# Patient Record
Sex: Male | Born: 1955 | Race: White | Hispanic: No | Marital: Married | Smoking: Never smoker
Health system: Southern US, Community
[De-identification: ages and names within clinical notes are randomized; demographics above are authoritative.]

## PROBLEM LIST (undated history)

## (undated) DIAGNOSIS — E785 Hyperlipidemia, unspecified: Secondary | ICD-10-CM

## (undated) DIAGNOSIS — Z789 Other specified health status: Secondary | ICD-10-CM

## (undated) DIAGNOSIS — R748 Abnormal levels of other serum enzymes: Secondary | ICD-10-CM

## (undated) DIAGNOSIS — K219 Gastro-esophageal reflux disease without esophagitis: Secondary | ICD-10-CM

## (undated) DIAGNOSIS — F17229 Nicotine dependence, chewing tobacco, with unspecified nicotine-induced disorders: Secondary | ICD-10-CM

## (undated) DIAGNOSIS — E669 Obesity, unspecified: Secondary | ICD-10-CM

## (undated) DIAGNOSIS — I2510 Atherosclerotic heart disease of native coronary artery without angina pectoris: Secondary | ICD-10-CM

## (undated) DIAGNOSIS — R0600 Dyspnea, unspecified: Secondary | ICD-10-CM

## (undated) DIAGNOSIS — M48061 Spinal stenosis, lumbar region without neurogenic claudication: Secondary | ICD-10-CM

## (undated) DIAGNOSIS — I10 Essential (primary) hypertension: Secondary | ICD-10-CM

## (undated) HISTORY — DX: Nicotine dependence, chewing tobacco, with unspecified nicotine-induced disorders: F17.229

## (undated) HISTORY — DX: Other specified health status: Z78.9

## (undated) HISTORY — DX: Obesity, unspecified: E66.9

## (undated) HISTORY — DX: Spinal stenosis, lumbar region without neurogenic claudication: M48.061

## (undated) HISTORY — DX: Abnormal levels of other serum enzymes: R74.8

## (undated) HISTORY — DX: Essential (primary) hypertension: I10

## (undated) HISTORY — DX: Gastro-esophageal reflux disease without esophagitis: K21.9

## (undated) HISTORY — DX: Hyperlipidemia, unspecified: E78.5

## (undated) HISTORY — PX: CARDIAC CATHETERIZATION: SHX172

## (undated) HISTORY — PX: OTHER SURGICAL HISTORY: SHX169

---

## 2015-10-18 DIAGNOSIS — Z6833 Body mass index (BMI) 33.0-33.9, adult: Secondary | ICD-10-CM

## 2015-10-18 DIAGNOSIS — E785 Hyperlipidemia, unspecified: Secondary | ICD-10-CM

## 2015-10-18 DIAGNOSIS — Z79899 Other long term (current) drug therapy: Secondary | ICD-10-CM

## 2015-10-18 DIAGNOSIS — E669 Obesity, unspecified: Secondary | ICD-10-CM

## 2015-10-18 DIAGNOSIS — K219 Gastro-esophageal reflux disease without esophagitis: Secondary | ICD-10-CM

## 2015-10-18 HISTORY — DX: Gastro-esophageal reflux disease without esophagitis: K21.9

## 2015-10-18 HISTORY — DX: Other long term (current) drug therapy: Z79.899

## 2015-10-18 HISTORY — DX: Body mass index (BMI) 33.0-33.9, adult: Z68.33

## 2015-10-18 HISTORY — DX: Obesity, unspecified: E66.9

## 2015-10-18 HISTORY — DX: Hyperlipidemia, unspecified: E78.5

## 2019-03-11 DIAGNOSIS — I10 Essential (primary) hypertension: Secondary | ICD-10-CM

## 2019-03-11 DIAGNOSIS — M5416 Radiculopathy, lumbar region: Secondary | ICD-10-CM

## 2019-03-11 HISTORY — DX: Essential (primary) hypertension: I10

## 2019-03-11 HISTORY — DX: Radiculopathy, lumbar region: M54.16

## 2020-02-25 ENCOUNTER — Other Ambulatory Visit: Payer: Self-pay

## 2020-02-25 DIAGNOSIS — T7840XA Allergy, unspecified, initial encounter: Secondary | ICD-10-CM

## 2020-02-25 DIAGNOSIS — I10 Essential (primary) hypertension: Secondary | ICD-10-CM

## 2020-02-25 HISTORY — DX: Essential (primary) hypertension: I10

## 2020-02-25 HISTORY — DX: Allergy, unspecified, initial encounter: T78.40XA

## 2020-02-26 ENCOUNTER — Encounter: Payer: Self-pay | Admitting: Cardiology

## 2020-02-26 ENCOUNTER — Ambulatory Visit (INDEPENDENT_AMBULATORY_CARE_PROVIDER_SITE_OTHER): Payer: PRIVATE HEALTH INSURANCE | Admitting: Cardiology

## 2020-02-26 ENCOUNTER — Other Ambulatory Visit: Payer: Self-pay

## 2020-02-26 VITALS — BP 132/84 | HR 68 | Ht 73.0 in | Wt 261.4 lb

## 2020-02-26 DIAGNOSIS — I10 Essential (primary) hypertension: Secondary | ICD-10-CM

## 2020-02-26 DIAGNOSIS — E669 Obesity, unspecified: Secondary | ICD-10-CM

## 2020-02-26 DIAGNOSIS — R0789 Other chest pain: Secondary | ICD-10-CM

## 2020-02-26 DIAGNOSIS — E785 Hyperlipidemia, unspecified: Secondary | ICD-10-CM

## 2020-02-26 DIAGNOSIS — R072 Precordial pain: Secondary | ICD-10-CM

## 2020-02-26 DIAGNOSIS — R011 Cardiac murmur, unspecified: Secondary | ICD-10-CM

## 2020-02-26 HISTORY — DX: Other chest pain: R07.89

## 2020-02-26 HISTORY — DX: Cardiac murmur, unspecified: R01.1

## 2020-02-26 MED ORDER — NITROGLYCERIN 0.4 MG SL SUBL: 0.4000 mg | SUBLINGUAL_TABLET | SUBLINGUAL | 6 refills | Status: DC | PRN

## 2020-02-26 NOTE — Patient Instructions (Signed)
Medication Instructions:  Your physician has recommended you make the following change in your medication:   Take Nitroglycerin as needed for chest pain.  *If you need a refill on your cardiac medications before your next appointment, please call your pharmacy*   Lab Work: Your physician recommends that you return for lab work in: At your echo or lexiscan appointment.  You need to have labs done when you are fasting.  You can come Monday through Friday 8:30 am to 12:00 pm and 1:15 to 4:30. You do not need to make an appointment as the order has already been placed. The labs you are going to have done are BMET, CBC, TSH, LFT and Lipids.  If you have labs (blood work) drawn today and your tests are completely normal, you will receive your results only by: Marland Kitchen MyChart Message (if you have MyChart) OR . A paper copy in the mail If you have any lab test that is abnormal or we need to change your treatment, we will call you to review the results.   Testing/Procedures: Your physician has requested that you have an echocardiogram. Echocardiography is a painless test that uses sound waves to create images of your heart. It provides your doctor with information about the size and shape of your heart and how well your heart's chambers and valves are working. This procedure takes approximately one hour. There are no restrictions for this procedure.  Your physician has requested that you have a lexiscan myoview. For further information please visit [email or web address] Please follow instruction sheet, as given.  The test will take approximately 3 to 4 hours to complete; you may bring reading material.  If someone comes with you to your appointment, they will need to remain in the main lobby due to limited space in the testing area.  How to prepare for your Myocardial Perfusion Test:  . Do not eat or drink 3 hours prior to your test, except you may have water. . Do not consume products containing  caffeine (regular or decaffeinated) 12 hours prior to your test. (ex: coffee, chocolate, sodas, tea). . Do bring a list of your current medications with you.  If not listed below, you may take your medications as normal. . Do wear comfortable clothes (no dresses or overalls) and walking shoes, tennis shoes preferred (No heels or open toe shoes are allowed). . Do NOT wear cologne, perfume, aftershave, or lotions (deodorant is allowed). . If these instructions are not followed, your test will have to be rescheduled.    Follow-Up: At [facility], you and your health needs are our priority.  As part of our continuing mission to provide you with exceptional heart care, we have created designated Provider Care Teams.  These Care Teams include your primary Cardiologist (physician) and Advanced Practice Providers (APPs -  Physician Assistants and Nurse Practitioners) who all work together to provide you with the care you need, when you need it.  We recommend signing up for the patient portal called "MyChart".  Sign up information is provided on this After Visit Summary.  MyChart is used to connect with patients for Virtual Visits (Telemedicine).  Patients are able to view lab/test results, encounter notes, upcoming appointments, etc.  Non-urgent messages can be sent to your provider as well.   To learn more about what you can do with MyChart, go to [email or web address].    Your next appointment:   2 month(s)  The format for your next appointment:   In Person  Provider:  Belva Crome, MD   Other Instructions  Cardiac Nuclear Scan A cardiac nuclear scan is a test that is done to check the flow of blood to your heart. It is done when you are resting and when you are exercising. The test looks for problems such as:  Not enough blood reaching a portion of the heart.  The heart muscle not working as it should. You may need this test if:  You have heart disease.  You have had lab  results that are not normal.  You have had heart surgery or a balloon procedure to open up blocked arteries (angioplasty).  You have chest pain.  You have shortness of breath. In this test, a special dye (tracer) is put into your bloodstream. The tracer will travel to your heart. A camera will then take pictures of your heart to see how the tracer moves through your heart. This test is usually done at a hospital and takes 2-4 hours. Tell a doctor about:  Any allergies you have.  All medicines you are taking, including vitamins, herbs, eye drops, creams, and over-the-counter medicines.  Any problems you or family members have had with anesthetic medicines.  Any blood disorders you have.  Any surgeries you have had.  Any medical conditions you have.  Whether you are pregnant or may be pregnant. What are the risks? Generally, this is a safe test. However, problems may occur, such as:  Serious chest pain and heart attack. This is only a risk if the stress portion of the test is done.  Rapid heartbeat.  A feeling of warmth in your chest. This feeling usually does not last long.  Allergic reaction to the tracer. What happens before the test?  Ask your doctor about changing or stopping your normal medicines. This is important.  Follow instructions from your doctor about what you cannot eat or drink.  Remove your jewelry on the day of the test. What happens during the test?  An IV tube will be inserted into one of your veins.  Your doctor will give you a small amount of tracer through the IV tube.  You will wait for 20-40 minutes while the tracer moves through your bloodstream.  Your heart will be monitored with an electrocardiogram (ECG).  You will lie down on an exam table.  Pictures of your heart will be taken for about 15-20 minutes.  You may also have a stress test. For this test, one of these things may be done: ? You will be asked to exercise on a treadmill or a  stationary bike. ? You will be given medicines that will make your heart work harder. This is done if you are unable to exercise.  When blood flow to your heart has peaked, a tracer will again be given through the IV tube.  After 20-40 minutes, you will get back on the exam table. More pictures will be taken of your heart.  Depending on the tracer that is used, more pictures may need to be taken 3-4 hours later.  Your IV tube will be removed when the test is over. The test may vary among doctors and hospitals. What happens after the test?  Ask your doctor: ? Whether you can return to your normal schedule, including diet, activities, and medicines. ? Whether you should drink more fluids. This will help to remove the tracer from your body. Drink enough fluid to keep your pee (urine) pale yellow.  Ask your doctor, or the department that is  doing the test: ? When will my results be ready? ? How will I get my results? Summary  A cardiac nuclear scan is a test that is done to check the flow of blood to your heart.  Tell your doctor whether you are pregnant or may be pregnant.  Before the test, ask your doctor about changing or stopping your normal medicines. This is important.  Ask your doctor whether you can return to your normal activities. You may be asked to drink more fluids. This information is not intended to replace advice given to you by your health care provider. Make sure you discuss any questions you have with your health care provider. Document Revised: 10/09/2018 Document Reviewed: 12/03/2017 Elsevier Patient Education  2020 [employer]  Echocardiogram An echocardiogram is a procedure that uses painless sound waves (ultrasound) to produce an image of the heart. Images from an echocardiogram can provide important information about:  Signs of coronary artery disease (CAD).  Aneurysm detection. An aneurysm is a weak or damaged part of an artery wall that bulges out from  the normal force of blood pumping through the body.  Heart size and shape. Changes in the size or shape of the heart can be associated with certain conditions, including heart failure, aneurysm, and CAD.  Heart muscle function.  Heart valve function.  Signs of a past heart attack.  Fluid buildup around the heart.  Thickening of the heart muscle.  A tumor or infectious growth around the heart valves. Tell a health care provider about:  Any allergies you have.  All medicines you are taking, including vitamins, herbs, eye drops, creams, and over-the-counter medicines.  Any blood disorders you have.  Any surgeries you have had.  Any medical conditions you have.  Whether you are pregnant or may be pregnant. What are the risks? Generally, this is a safe procedure. However, problems may occur, including:  Allergic reaction to dye (contrast) that may be used during the procedure. What happens before the procedure? No specific preparation is needed. You may eat and drink normally. What happens during the procedure?   An IV tube may be inserted into one of your veins.  You may receive contrast through this tube. A contrast is an injection that improves the quality of the pictures from your heart.  A gel will be applied to your chest.  A wand-like tool (transducer) will be moved over your chest. The gel will help to transmit the sound waves from the transducer.  The sound waves will harmlessly bounce off of your heart to allow the heart images to be captured in real-time motion. The images will be recorded on a computer. The procedure may vary among health care providers and hospitals. What happens after the procedure?  You may return to your normal, everyday life, including diet, activities, and medicines, unless your health care provider tells you not to do that. Summary  An echocardiogram is a procedure that uses painless sound waves (ultrasound) to produce an image of the  heart.  Images from an echocardiogram can provide important information about the size and shape of your heart, heart muscle function, heart valve function, and fluid buildup around your heart.  You do not need to do anything to prepare before this procedure. You may eat and drink normally.  After the echocardiogram is completed, you may return to your normal, everyday life, unless your health care provider tells you not to do that. This information is not intended to replace advice given  to you by your health care provider. Make sure you discuss any questions you have with your health care provider. Document Revised: 10/10/2018 Document Reviewed: 07/22/2016 Elsevier Patient Education  2020 [employer].

## 2020-02-26 NOTE — Progress Notes (Signed)
Cardiology Office Note:    Date:  02/26/2020   ID:  Dan Nelson, DOB [birth year], MRN [id number]  PCP:  Ronal Fear, NP  Cardiologist:  Garwin Brothers, MD   Referring MD: Ronal Fear, NP    ASSESSMENT:    1. Essential hypertension   2. Chest tightness   3. Dyslipidemia   4. Obesity, Class I, BMI 30-34.9    PLAN:    In order of problems listed above:  1. Primary prevention stressed with the patient.  Importance of compliance with diet medication stressed any vocalized understanding. 2. Essential hypertension: Blood pressure stable and diet was emphasized 3. Chest discomfort: His symptoms are atypical and have mixed features.  He has multiple risk factors for coronary artery disease and therefore we will do a Lexiscan sestamibi. 4. Cardiac murmur: Echocardiogram will be done to assess murmur on auscultation. 5. Mixed dyslipidemia: There is mention of statin intolerance and elevated LFTs.  I will try to get a copy of his blood work and also will do complete blood work when he comes for the stress test. 6. Overweight/obesity: Diet was emphasized.  Weight reduction was stressed and he promises to do better. 7. Patient will be seen in follow-up appointment in 2 months or earlier if the patient has any concerns    Medication Adjustments/Labs and Tests Ordered: Current medicines are reviewed at length with the patient today.  Concerns regarding medicines are outlined above.  No orders of the defined types were placed in this encounter.  No orders of the defined types were placed in this encounter.    History of Present Illness:    Dan Nelson is a 64 y.o. male who is being seen today for the evaluation of chest discomfort on exertion at the request of Lam, Tawny Asal, NP.  Patient is a pleasant 64 year old male.  He has past medical history of essential hypertension and dyslipidemia.  He is overweight.  He mentions to me that he had 2 episodes of chest tightness when he was  exerting himself.  No radiation to the neck or to the arms.  On other times when he exerts he does not have any issues.  He also mentioned to me that he is sexually active and sexual activity does not bring around any of the symptoms.  At the time of my evaluation, the patient is alert awake oriented and in no distress.  Past Medical History:  Diagnosis Date  . Benign essential hypertension   . 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 GERD without esophagitis   . Hyperlipidemia   . Liver enzyme elevation   . Obesity   . Spinal stenosis, lumbar   . Statin intolerance     Past Surgical History:  Procedure Laterality Date  . Cosmetic ear surgery bilateral, childhood    . 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 aspirin 81 MG EC tablet Take 81 mg by mouth daily.  Marland Kitchen ezetimibe (ZETIA) 10 MG tablet Take 10 mg by mouth daily.  . hydrochlorothiazide (HYDRODIURIL) 12.5 MG tablet Take 12.5 mg by mouth daily.  Marland Kitchen lisinopril (ZESTRIL) 40 MG tablet Take 40 mg by mouth daily.  Marland Kitchen omeprazole (PRILOSEC) 20 MG capsule Take 20 mg by mouth as needed.     Allergies:   Penicillins, Augmentin [amoxicillin-pot clavulanate], Crestor [rosuvastatin], and Zocor [simvastatin]   Social History   Socioeconomic History  .  Marital status: Married    Spouse name: Not on file  . Number of children: Not on file  . Years of education: Not on file  . Highest education level: Not on file  Occupational History  . Not on file  Tobacco Use  . Smoking status: Unknown If Ever Smoked  . Smokeless tobacco: Current User    Types: Snuff  Substance and Sexual Activity  . Alcohol use: Not on file  . Drug use: Not on file  . Sexual activity: Not on file  Other Topics Concern  . Not on file  Social History Narrative  . Not on file   Social Determinants of Health   Financial Resource Strain:   . Difficulty of Paying  Living Expenses: Not on file  Food Insecurity:   . Worried About Programme researcher, broadcasting/film/video in the Last Year: Not on file  . Ran Out of Food in the Last Year: Not on file  Transportation Needs:   . Lack of Transportation (Medical): Not on file  . Lack of Transportation (Non-Medical): Not on file  Physical Activity:   . Days of Exercise per Week: Not on file  . Minutes of Exercise per Session: Not on file  Stress:   . Feeling of Stress : Not on file  Social Connections:   . Frequency of Communication with Friends and Family: Not on file  . Frequency of Social Gatherings with Friends and Family: Not on file  . Attends Religious Services: Not on file  . Active Member of Clubs or Organizations: Not on file  . Attends Banker Meetings: Not on file  . Marital Status: Not on file     Family History: The patient's family history includes CAD in his father and mother; Heart attack in his father and mother; Hypercholesterolemia in his brother and mother; Stroke in his father.  ROS:   Please see the history of present illness.    All other systems reviewed and are negative.  EKGs/Labs/Other Studies Reviewed:    The following studies were reviewed today: I discussed my findings with the patient at length.  EKG reveals sinus rhythm nonspecific ST-T changes.   Recent Labs: No results found for requested labs within last 8760 hours.  Recent Lipid Panel No results found for: CHOL, TRIG, HDL, CHOLHDL, VLDL, LDLCALC, LDLDIRECT  Physical Exam:    VS:  BP 132/84   Pulse 68   Ht 6\' 1"  (1.854 m)   Wt 261 lb 6.4 oz (118.6 kg)   SpO2 95%   BMI 34.49 kg/m     Wt Readings from Last 3 Encounters:  02/26/20 261 lb 6.4 oz (118.6 kg)     GEN: Patient is in no acute distress HEENT: Normal NECK: No JVD; No carotid bruits LYMPHATICS: No lymphadenopathy CARDIAC: S1 S2 regular, 2/6 systolic murmur at the apex. RESPIRATORY:  Clear to auscultation without rales, wheezing or rhonchi    ABDOMEN: Soft, non-tender, non-distended MUSCULOSKELETAL:  No edema; No deformity  SKIN: Warm and dry NEUROLOGIC:  Alert and oriented x 3 PSYCHIATRIC:  Normal affect    Signed, 02/28/20, MD  02/26/2020 3:07 PM    [facility]

## 2020-03-03 ENCOUNTER — Telehealth (HOSPITAL_COMMUNITY): Payer: Self-pay | Admitting: *Deleted

## 2020-03-03 LAB — HEPATIC FUNCTION PANEL
ALT: 46 IU/L — ABNORMAL HIGH (ref 0–44)
AST: 31 IU/L (ref 0–40)
Albumin: 4.4 g/dL (ref 3.8–4.8)
Alkaline Phosphatase: 57 IU/L (ref 48–121)
Bilirubin Total: 0.7 mg/dL (ref 0.0–1.2)
Bilirubin, Direct: 0.16 mg/dL (ref 0.00–0.40)
Total Protein: 6.6 g/dL (ref 6.0–8.5)

## 2020-03-03 LAB — BASIC METABOLIC PANEL
BUN/Creatinine Ratio: 13 (ref 10–24)
BUN: 15 mg/dL (ref 8–27)
CO2: 25 mmol/L (ref 20–29)
Calcium: 9 mg/dL (ref 8.6–10.2)
Chloride: 101 mmol/L (ref 96–106)
Creatinine, Ser: 1.18 mg/dL (ref 0.76–1.27)
GFR calc Af Amer: 77 mL/min/{1.73_m2} (ref >59)
GFR calc non Af Amer: 66 mL/min/{1.73_m2} (ref >59)
Glucose: 85 mg/dL (ref 65–99)
Potassium: 4 mmol/L (ref 3.5–5.2)
Sodium: 137 mmol/L (ref 134–144)

## 2020-03-03 LAB — CBC WITH DIFFERENTIAL/PLATELET
Basophils Absolute: 0.1 10*3/uL (ref 0.0–0.2)
Basos: 1 %
EOS (ABSOLUTE): 0.2 10*3/uL (ref 0.0–0.4)
Eos: 2 %
Hematocrit: 48 % (ref 37.5–51.0)
Hemoglobin: 16.8 g/dL (ref 13.0–17.7)
Immature Grans (Abs): 0 10*3/uL (ref 0.0–0.1)
Immature Granulocytes: 0 %
Lymphocytes Absolute: 2.5 10*3/uL (ref 0.7–3.1)
Lymphs: 31 %
MCH: 32.4 pg (ref 26.6–33.0)
MCHC: 35 g/dL (ref 31.5–35.7)
MCV: 93 fL (ref 79–97)
Monocytes Absolute: 0.7 10*3/uL (ref 0.1–0.9)
Monocytes: 9 %
Neutrophils Absolute: 4.7 10*3/uL (ref 1.4–7.0)
Neutrophils: 57 %
Platelets: 255 10*3/uL (ref 150–450)
RBC: 5.18 x10E6/uL (ref 4.14–5.80)
RDW: 11.9 % (ref 11.6–15.4)
WBC: 8.1 10*3/uL (ref 3.4–10.8)

## 2020-03-03 LAB — LIPID PANEL
Chol/HDL Ratio: 4.4 ratio (ref 0.0–5.0)
Cholesterol, Total: 160 mg/dL (ref 100–199)
HDL: 36 mg/dL — ABNORMAL LOW (ref >39)
LDL Chol Calc (NIH): 103 mg/dL — ABNORMAL HIGH (ref 0–99)
Triglycerides: 115 mg/dL (ref 0–149)
VLDL Cholesterol Cal: 21 mg/dL (ref 5–40)

## 2020-03-03 LAB — TSH: TSH: 1.08 u[IU]/mL (ref 0.450–4.500)

## 2020-03-03 NOTE — Telephone Encounter (Signed)
Patient given detailed instructions per Myocardial Perfusion Study Information Sheet for the test on 03/09/20 at 11:30. Patient notified to arrive 15 minutes early and that it is imperative to arrive on time for appointment to keep from having the test rescheduled.  If you need to cancel or reschedule your appointment, please call the office within 24 hours of your appointment. . Patient verbalized understanding.Dan Nelson

## 2020-03-09 ENCOUNTER — Ambulatory Visit (INDEPENDENT_AMBULATORY_CARE_PROVIDER_SITE_OTHER): Payer: PRIVATE HEALTH INSURANCE

## 2020-03-09 ENCOUNTER — Other Ambulatory Visit: Payer: Self-pay

## 2020-03-09 DIAGNOSIS — E785 Hyperlipidemia, unspecified: Secondary | ICD-10-CM

## 2020-03-09 DIAGNOSIS — I10 Essential (primary) hypertension: Secondary | ICD-10-CM | POA: Diagnosis not present

## 2020-03-09 DIAGNOSIS — R0789 Other chest pain: Secondary | ICD-10-CM

## 2020-03-09 DIAGNOSIS — R072 Precordial pain: Secondary | ICD-10-CM | POA: Diagnosis not present

## 2020-03-09 LAB — MYOCARDIAL PERFUSION IMAGING
LV dias vol: 124 mL (ref 62–150)
LV sys vol: 54 mL
Peak HR: 74 {beats}/min
Rest HR: 55 {beats}/min
SDS: 2
SRS: 2
SSS: 4
TID: 1.03

## 2020-03-09 MED ORDER — TECHNETIUM TC 99M TETROFOSMIN IV KIT
10.4000 | PACK | Freq: Once | INTRAVENOUS | Status: AC | PRN
  Administered 2020-03-09: 10.4 via INTRAVENOUS

## 2020-03-09 MED ORDER — TECHNETIUM TC 99M TETROFOSMIN IV KIT
32.3000 | PACK | Freq: Once | INTRAVENOUS | Status: AC | PRN
  Administered 2020-03-09: 32.3 via INTRAVENOUS

## 2020-03-09 MED ORDER — REGADENOSON 0.4 MG/5ML IV SOLN
0.4000 mg | Freq: Once | INTRAVENOUS | Status: AC
  Administered 2020-03-09: 0.4 mg via INTRAVENOUS

## 2020-03-11 ENCOUNTER — Encounter: Payer: Self-pay | Admitting: Cardiology

## 2020-03-11 ENCOUNTER — Ambulatory Visit (INDEPENDENT_AMBULATORY_CARE_PROVIDER_SITE_OTHER): Payer: PRIVATE HEALTH INSURANCE | Admitting: Cardiology

## 2020-03-11 ENCOUNTER — Other Ambulatory Visit: Payer: Self-pay

## 2020-03-11 VITALS — BP 128/84 | HR 67 | Wt 260.4 lb

## 2020-03-11 DIAGNOSIS — I209 Angina pectoris, unspecified: Secondary | ICD-10-CM

## 2020-03-11 DIAGNOSIS — R9439 Abnormal result of other cardiovascular function study: Secondary | ICD-10-CM

## 2020-03-11 DIAGNOSIS — I10 Essential (primary) hypertension: Secondary | ICD-10-CM

## 2020-03-11 DIAGNOSIS — E785 Hyperlipidemia, unspecified: Secondary | ICD-10-CM

## 2020-03-11 HISTORY — DX: Angina pectoris, unspecified: I20.9

## 2020-03-11 HISTORY — DX: Abnormal result of other cardiovascular function study: R94.39

## 2020-03-11 NOTE — Progress Notes (Signed)
Cardiology Office Note:    Date:  03/11/2020   ID:  Dan Nelson, DOB 1955-10-06, MRN [id number]  PCP:  Ronal Fear, NP  Cardiologist:  Garwin Brothers, MD   Referring MD: Ronal Fear, NP    ASSESSMENT:    1. Essential hypertension   2. Dyslipidemia   3. Angina pectoris (HCC)   4. Abnormal nuclear stress test    PLAN:    In order of problems listed above:  1. Angina pectoris: Abnormal nuclear stress test: His symptoms are very concerning and following recommendations were made to him.  Sublingual nitroglycerin prescription was sent, its protocol and 911 protocol explained and the patient vocalized understanding questions were answered to the patient's satisfaction.  He was advised to take 81 mg coated aspirin on a daily basis.I discussed coronary angiography and left heart catheterization with the patient at extensive length. Procedure, benefits and potential risks were explained. Patient had multiple questions which were answered to the patient's satisfaction. Patient agreed and consented for the procedure. Further recommendations will be made based on the findings of the coronary angiography. In the interim. The patient has any significant symptoms he knows to go to the nearest emergency room. 2. Essential hypertension blood pressure stable 3. Mixed dyslipidemia and obesity: Diet was emphasized.  I am not going to start him on statins as his LFTs are mildly elevated.  I will await results of coronary angiography and further assess this.  I discussed this with him at length.  Diet was emphasized weight reduction was stressed and he promises to do better.  Patient and wife had multiple questions which were answered to their satisfaction.   Medication Adjustments/Labs and Tests Ordered: Current medicines are reviewed at length with the patient today.  Concerns regarding medicines are outlined above.  No orders of the defined types were placed in this encounter.  No orders of the  defined types were placed in this encounter.    No chief complaint on file.    History of Present Illness:    Dan Nelson is a 64 y.o. male.  Patient has past medical history of essential hypertension and dyslipidemia.  He is evaluated by me for abnormal nuclear stress test.  He had symptoms which were suggestive of angina and stress test is abnormal therefore he is here for evaluation.  He mentions to me that when he exerts himself he experiences chest pressure.  No orthopnea or PND.  His wife accompanies him for this visit today.  At the time of my evaluation, the patient is alert awake oriented and in no distress.  Past Medical History:  Diagnosis Date  . Benign essential hypertension   . 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 GERD without esophagitis   . Hyperlipidemia   . Liver enzyme elevation   . Obesity   . Spinal stenosis, lumbar   . Statin intolerance     Past Surgical History:  Procedure Laterality Date  . Cosmetic ear surgery bilateral, childhood    . 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 aspirin 81 MG EC tablet Take 81 mg by mouth daily.  Marland Kitchen ezetimibe (ZETIA) 10 MG tablet Take 10 mg by mouth daily.  . hydrochlorothiazide (HYDRODIURIL) 12.5 MG tablet Take 12.5 mg by mouth daily.  Marland Kitchen lisinopril (ZESTRIL) 40 MG tablet Take 40 mg by mouth daily.  . nitroGLYCERIN (NITROSTAT) 0.4 MG SL tablet  Place 1 tablet (0.4 mg total) under the tongue every 5 (five) minutes as needed.  Marland Kitchen omeprazole (PRILOSEC) 20 MG capsule Take 20 mg by mouth as needed.     Allergies:   Penicillins, Augmentin [amoxicillin-pot clavulanate], Crestor [rosuvastatin], and Zocor [simvastatin]   Social History   Socioeconomic History  . Marital status: Married    Spouse name: Not on file  . Number of children: Not on file  . Years of education: Not on file  . Highest education level: Not on  file  Occupational History  . Not on file  Tobacco Use  . Smoking status: Unknown If Ever Smoked  . Smokeless tobacco: Current User    Types: Snuff  Substance and Sexual Activity  . Alcohol use: Not on file  . Drug use: Not on file  . Sexual activity: Not on file  Other Topics Concern  . Not on file  Social History Narrative  . Not on file   Social Determinants of Health   Financial Resource Strain:   . Difficulty of Paying Living Expenses: Not on file  Food Insecurity:   . Worried About Programme researcher, broadcasting/film/video in the Last Year: Not on file  . Ran Out of Food in the Last Year: Not on file  Transportation Needs:   . Lack of Transportation (Medical): Not on file  . Lack of Transportation (Non-Medical): Not on file  Physical Activity:   . Days of Exercise per Week: Not on file  . Minutes of Exercise per Session: Not on file  Stress:   . Feeling of Stress : Not on file  Social Connections:   . Frequency of Communication with Friends and Family: Not on file  . Frequency of Social Gatherings with Friends and Family: Not on file  . Attends Religious Services: Not on file  . Active Member of Clubs or Organizations: Not on file  . Attends Banker Meetings: Not on file  . Marital Status: Not on file     Family History: The patient's family history includes CAD in his father and mother; Heart attack in his father and mother; Hypercholesterolemia in his brother and mother; Stroke in his father.  ROS:   Please see the history of present illness.    All other systems reviewed and are negative.  EKGs/Labs/Other Studies Reviewed:    The following studies were reviewed today: Study Highlights   Nuclear stress EF: 57%.  The left ventricular ejection fraction is normal (55-65%).  Defect 1: There is a medium defect of moderate severity present in the basal inferolateral and mid inferolateral location.  This is an intermediate risk study.  Findings consistent with  ischemia.      Recent Labs: 03/02/2020: ALT 46; BUN 15; Creatinine, Ser 1.18; Hemoglobin 16.8; Platelets 255; Potassium 4.0; Sodium 137; TSH 1.080  Recent Lipid Panel    Component Value Date/Time   CHOL 160 03/02/2020 1002   TRIG 115 03/02/2020 1002   HDL 36 (L) 03/02/2020 1002   CHOLHDL 4.4 03/02/2020 1002   LDLCALC 103 (H) 03/02/2020 1002    Physical Exam:    VS:  BP 128/84   Pulse 67   Wt 260 lb 6.4 oz (118.1 kg)   SpO2 95%   BMI 34.36 kg/m     Wt Readings from Last 3 Encounters:  03/11/20 260 lb 6.4 oz (118.1 kg)  03/09/20 261 lb (118.4 kg)  02/26/20 261 lb 6.4 oz (118.6 kg)     GEN: Patient is  in no acute distress HEENT: Normal NECK: No JVD; No carotid bruits LYMPHATICS: No lymphadenopathy CARDIAC: Hear sounds regular, 2/6 systolic murmur at the apex. RESPIRATORY:  Clear to auscultation without rales, wheezing or rhonchi  ABDOMEN: Soft, non-tender, non-distended MUSCULOSKELETAL:  No edema; No deformity  SKIN: Warm and dry NEUROLOGIC:  Alert and oriented x 3 PSYCHIATRIC:  Normal affect   Signed, Garwin Brothers, MD  03/11/2020 3:59 PM    [facility]

## 2020-03-11 NOTE — H&P (View-Only) (Signed)
Cardiology Office Note:    Date:  03/11/2020   ID:  Dan Nelson, DOB 1955-10-06, MRN [id number]  PCP:  Ronal Fear, NP  Cardiologist:  Garwin Brothers, MD   Referring MD: Ronal Fear, NP    ASSESSMENT:    1. Essential hypertension   2. Dyslipidemia   3. Angina pectoris (HCC)   4. Abnormal nuclear stress test    PLAN:    In order of problems listed above:  1. Angina pectoris: Abnormal nuclear stress test: His symptoms are very concerning and following recommendations were made to him.  Sublingual nitroglycerin prescription was sent, its protocol and 911 protocol explained and the patient vocalized understanding questions were answered to the patient's satisfaction.  He was advised to take 81 mg coated aspirin on a daily basis.I discussed coronary angiography and left heart catheterization with the patient at extensive length. Procedure, benefits and potential risks were explained. Patient had multiple questions which were answered to the patient's satisfaction. Patient agreed and consented for the procedure. Further recommendations will be made based on the findings of the coronary angiography. In the interim. The patient has any significant symptoms he knows to go to the nearest emergency room. 2. Essential hypertension blood pressure stable 3. Mixed dyslipidemia and obesity: Diet was emphasized.  I am not going to start him on statins as his LFTs are mildly elevated.  I will await results of coronary angiography and further assess this.  I discussed this with him at length.  Diet was emphasized weight reduction was stressed and he promises to do better.  Patient and wife had multiple questions which were answered to their satisfaction.   Medication Adjustments/Labs and Tests Ordered: Current medicines are reviewed at length with the patient today.  Concerns regarding medicines are outlined above.  No orders of the defined types were placed in this encounter.  No orders of the  defined types were placed in this encounter.    No chief complaint on file.    History of Present Illness:    Dan Nelson is a 64 y.o. male.  Patient has past medical history of essential hypertension and dyslipidemia.  He is evaluated by me for abnormal nuclear stress test.  He had symptoms which were suggestive of angina and stress test is abnormal therefore he is here for evaluation.  He mentions to me that when he exerts himself he experiences chest pressure.  No orthopnea or PND.  His wife accompanies him for this visit today.  At the time of my evaluation, the patient is alert awake oriented and in no distress.  Past Medical History:  Diagnosis Date  . Benign essential hypertension   . 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 GERD without esophagitis   . Hyperlipidemia   . Liver enzyme elevation   . Obesity   . Spinal stenosis, lumbar   . Statin intolerance     Past Surgical History:  Procedure Laterality Date  . Cosmetic ear surgery bilateral, childhood    . 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 aspirin 81 MG EC tablet Take 81 mg by mouth daily.  Marland Kitchen ezetimibe (ZETIA) 10 MG tablet Take 10 mg by mouth daily.  . hydrochlorothiazide (HYDRODIURIL) 12.5 MG tablet Take 12.5 mg by mouth daily.  Marland Kitchen lisinopril (ZESTRIL) 40 MG tablet Take 40 mg by mouth daily.  . nitroGLYCERIN (NITROSTAT) 0.4 MG SL tablet  Place 1 tablet (0.4 mg total) under the tongue every 5 (five) minutes as needed.  Marland Kitchen omeprazole (PRILOSEC) 20 MG capsule Take 20 mg by mouth as needed.     Allergies:   Penicillins, Augmentin [amoxicillin-pot clavulanate], Crestor [rosuvastatin], and Zocor [simvastatin]   Social History   Socioeconomic History  . Marital status: Married    Spouse name: Not on file  . Number of children: Not on file  . Years of education: Not on file  . Highest education level: Not on  file  Occupational History  . Not on file  Tobacco Use  . Smoking status: Unknown If Ever Smoked  . Smokeless tobacco: Current User    Types: Snuff  Substance and Sexual Activity  . Alcohol use: Not on file  . Drug use: Not on file  . Sexual activity: Not on file  Other Topics Concern  . Not on file  Social History Narrative  . Not on file   Social Determinants of Health   Financial Resource Strain:   . Difficulty of Paying Living Expenses: Not on file  Food Insecurity:   . Worried About Programme researcher, broadcasting/film/video in the Last Year: Not on file  . Ran Out of Food in the Last Year: Not on file  Transportation Needs:   . Lack of Transportation (Medical): Not on file  . Lack of Transportation (Non-Medical): Not on file  Physical Activity:   . Days of Exercise per Week: Not on file  . Minutes of Exercise per Session: Not on file  Stress:   . Feeling of Stress : Not on file  Social Connections:   . Frequency of Communication with Friends and Family: Not on file  . Frequency of Social Gatherings with Friends and Family: Not on file  . Attends Religious Services: Not on file  . Active Member of Clubs or Organizations: Not on file  . Attends Banker Meetings: Not on file  . Marital Status: Not on file     Family History: The patient's family history includes CAD in his father and mother; Heart attack in his father and mother; Hypercholesterolemia in his brother and mother; Stroke in his father.  ROS:   Please see the history of present illness.    All other systems reviewed and are negative.  EKGs/Labs/Other Studies Reviewed:    The following studies were reviewed today: Study Highlights   Nuclear stress EF: 57%.  The left ventricular ejection fraction is normal (55-65%).  Defect 1: There is a medium defect of moderate severity present in the basal inferolateral and mid inferolateral location.  This is an intermediate risk study.  Findings consistent with  ischemia.      Recent Labs: 03/02/2020: ALT 46; BUN 15; Creatinine, Ser 1.18; Hemoglobin 16.8; Platelets 255; Potassium 4.0; Sodium 137; TSH 1.080  Recent Lipid Panel    Component Value Date/Time   CHOL 160 03/02/2020 1002   TRIG 115 03/02/2020 1002   HDL 36 (L) 03/02/2020 1002   CHOLHDL 4.4 03/02/2020 1002   LDLCALC 103 (H) 03/02/2020 1002    Physical Exam:    VS:  BP 128/84   Pulse 67   Wt 260 lb 6.4 oz (118.1 kg)   SpO2 95%   BMI 34.36 kg/m     Wt Readings from Last 3 Encounters:  03/11/20 260 lb 6.4 oz (118.1 kg)  03/09/20 261 lb (118.4 kg)  02/26/20 261 lb 6.4 oz (118.6 kg)     GEN: Patient is  in no acute distress HEENT: Normal NECK: No JVD; No carotid bruits LYMPHATICS: No lymphadenopathy CARDIAC: Hear sounds regular, 2/6 systolic murmur at the apex. RESPIRATORY:  Clear to auscultation without rales, wheezing or rhonchi  ABDOMEN: Soft, non-tender, non-distended MUSCULOSKELETAL:  No edema; No deformity  SKIN: Warm and dry NEUROLOGIC:  Alert and oriented x 3 PSYCHIATRIC:  Normal affect   Signed, Garwin Brothers, MD  03/11/2020 3:59 PM    [facility]

## 2020-03-11 NOTE — Patient Instructions (Addendum)
Medication Instructions:  Your physician recommends that you continue on your current medications as directed. Please refer to the Current Medication list given to you today.  *If you need a refill on your cardiac medications before your next appointment, please call your pharmacy*   Lab Work: Bmp, Cbc- Today   If you have labs (blood work) drawn today and your tests are completely normal, you will receive your results only by: Marland Kitchen MyChart Message (if you have MyChart) OR . A paper copy in the mail If you have any lab test that is abnormal or we need to change your treatment, we will call you to review the results.   Testing/Procedures: Your physician has requested that you have a cardiac catheterization. Cardiac catheterization is used to diagnose and/or treat various heart conditions. Doctors may recommend this procedure for a number of different reasons. The most common reason is to evaluate chest pain. Chest pain can be a symptom of coronary artery disease (CAD), and cardiac catheterization can show whether plaque is narrowing or blocking your heart's arteries. This procedure is also used to evaluate the valves, as well as measure the blood flow and oxygen levels in different parts of your heart. For further information please visit [email or web address] Please follow instruction sheet, as given.  Due to recent COVID-19 restrictions implemented by our local and state authorities and in an effort to keep both patients and staff as safe as possible, our hospital system requires COVID-19 testing prior to certain scheduled hospital procedures.  Please go to [address] [facility] [place], [place] [address] on 03/13/20 before 12:30 pm .  This is a drive up testing site.  You will not need to exit your vehicle.  You will not be billed at the time of testing but may receive a bill later depending on your insurance.  The approximate cost of the test is $100.  You must agree to self-quarantine from the time of  your testing until the procedure date on 03/16/20.  This should included staying home with ONLY the people you live with.  Avoid take-out, grocery store shopping or leaving the house for any non-emergent reason.  Failure to have your COVID-19 test done on the date and time you have been scheduled will result in cancellation of your procedure.  Please call our office at [phone] if you have any questions.   Follow-Up: Keep up follow up appointment with Dr. Belva Crome    Other Instructions     MEDICAL [facility] CARDIOVASCULAR DIVISION [facility]ART[facility] AT [facility] [address] [place] [address] Dept: [phone] Loc: [phone]  Dan Nelson  03/11/2020  You are scheduled for a Cardiac Catheterization on Tuesday, September 14 with Dr. Verdis Prime.  1. Please arrive at the [facility] (Main Entrance A) at [facility]: [address] [place], [place] [address] at 5:30 AM (This time is two hours before your procedure to ensure your preparation). Free valet parking service is available.   Special note: Every effort is made to have your procedure done on time. Please understand that emergencies sometimes delay scheduled procedures.  2. Diet: Do not eat solid foods after midnight.  The patient may have clear liquids until 5am upon the day of the procedure.  3. Medication instructions in preparation for your procedure:  Hold Hydrochlorothiazide the morning of your procedure   On the morning of your procedure, take your Aspirin and any morning medicines NOT listed above.  You may use sips of water.  5. Plan for one night stay--bring personal belongings. 6. Bring a current list of your medications and current insurance cards. 7. You MUST have a responsible person to drive you home. 8. Someone MUST be with you the first 24 hours after you arrive home or your discharge will be delayed. 9. Please wear clothes that are easy to get on and off and wear  slip-on shoes.  Thank you for allowing [place] to care for you!   -- Dan Nelson [facility]

## 2020-03-12 ENCOUNTER — Telehealth: Payer: Self-pay

## 2020-03-12 LAB — CBC
Hematocrit: 48.8 % (ref 37.5–51.0)
Hemoglobin: 17.1 g/dL (ref 13.0–17.7)
MCH: 32.3 pg (ref 26.6–33.0)
MCHC: 35 g/dL (ref 31.5–35.7)
MCV: 92 fL (ref 79–97)
Platelets: 256 10*3/uL (ref 150–450)
RBC: 5.3 x10E6/uL (ref 4.14–5.80)
RDW: 12.1 % (ref 11.6–15.4)
WBC: 8.3 10*3/uL (ref 3.4–10.8)

## 2020-03-12 LAB — BASIC METABOLIC PANEL
BUN/Creatinine Ratio: 14 (ref 10–24)
BUN: 16 mg/dL (ref 8–27)
CO2: 22 mmol/L (ref 20–29)
Calcium: 9.3 mg/dL (ref 8.6–10.2)
Chloride: 104 mmol/L (ref 96–106)
Creatinine, Ser: 1.12 mg/dL (ref 0.76–1.27)
GFR calc Af Amer: 82 mL/min/{1.73_m2} (ref >59)
GFR calc non Af Amer: 71 mL/min/{1.73_m2} (ref >59)
Glucose: 86 mg/dL (ref 65–99)
Potassium: 4.1 mmol/L (ref 3.5–5.2)
Sodium: 138 mmol/L (ref 134–144)

## 2020-03-12 NOTE — Telephone Encounter (Signed)
Pt contacted pre-catheterization scheduled at [facility] JSR:PRXYVOP 03/16/20 Verified arrival time and place: [facility] Main Entrance A [facility]) at: 5:30 am    No solid food after midnight prior to cath, clear liquids until 5 AM day of procedure. CONTRAST ALLERGY: NO  AM meds can be  taken pre-cath with sips of water including: ASA 81 mg  Will hold his HCTZ the morning of the procedure.   Confirmed patient has responsible adult to drive home post procedure and be with patient first 24 hours after arriving home: yes  You are allowed ONE visitor in the waiting room during the time you are at the hospital for your procedure. Both you and your visitor must wear a mask once you enter the hospital.       COVID-19 Pre-Screening Questions:  . In the past 10 days have you had a new cough, shortness of breath, headache, congestion, fever (100 or greater) unexplained body aches, new sore throat, or sudden loss of taste or sense of smell? NO . In the past 10 days have you been around anyone with known Covid 19? NO . Have you been vaccinated for COVID-19? Yes, Spring 2021

## 2020-03-13 ENCOUNTER — Other Ambulatory Visit (HOSPITAL_COMMUNITY)
Admission: RE | Admit: 2020-03-13 | Discharge: 2020-03-13 | Disposition: A | Discharge status: Home / Self Care | Payer: Commercial Managed Care - PPO | Source: Ambulatory Visit | Attending: Interventional Cardiology | Admitting: Interventional Cardiology

## 2020-03-13 DIAGNOSIS — Z01812 Encounter for preprocedural laboratory examination: Secondary | ICD-10-CM | POA: Insufficient documentation

## 2020-03-13 DIAGNOSIS — Z20822 Contact with and (suspected) exposure to covid-19: Secondary | ICD-10-CM | POA: Diagnosis not present

## 2020-03-13 LAB — SARS CORONAVIRUS 2 (TAT 6-24 HRS): SARS Coronavirus 2: NEGATIVE

## 2020-03-15 NOTE — H&P (Signed)
Abnormal nuclear stress with inferobasal and mid inferior hypoperfusion

## 2020-03-15 NOTE — Telephone Encounter (Signed)
03/13/2020 Covid-19 negative.

## 2020-03-16 ENCOUNTER — Encounter (HOSPITAL_COMMUNITY)
Admission: RE | Disposition: A | Discharge status: Home / Self Care | Payer: Self-pay | Source: Home / Self Care | Attending: Interventional Cardiology

## 2020-03-16 ENCOUNTER — Encounter (HOSPITAL_COMMUNITY): Payer: Self-pay | Admitting: Interventional Cardiology

## 2020-03-16 ENCOUNTER — Other Ambulatory Visit: Payer: Self-pay

## 2020-03-16 ENCOUNTER — Ambulatory Visit (HOSPITAL_COMMUNITY)
Admission: RE | Admit: 2020-03-16 | Discharge: 2020-03-16 | Disposition: A | Discharge status: Home / Self Care | Payer: Commercial Managed Care - PPO | Attending: Interventional Cardiology | Admitting: Interventional Cardiology

## 2020-03-16 DIAGNOSIS — E782 Mixed hyperlipidemia: Secondary | ICD-10-CM | POA: Diagnosis not present

## 2020-03-16 DIAGNOSIS — Z6834 Body mass index (BMI) 34.0-34.9, adult: Secondary | ICD-10-CM | POA: Insufficient documentation

## 2020-03-16 DIAGNOSIS — I209 Angina pectoris, unspecified: Secondary | ICD-10-CM

## 2020-03-16 DIAGNOSIS — I25119 Atherosclerotic heart disease of native coronary artery with unspecified angina pectoris: Secondary | ICD-10-CM | POA: Insufficient documentation

## 2020-03-16 DIAGNOSIS — Z6833 Body mass index (BMI) 33.0-33.9, adult: Secondary | ICD-10-CM | POA: Diagnosis present

## 2020-03-16 DIAGNOSIS — Z880 Allergy status to penicillin: Secondary | ICD-10-CM | POA: Diagnosis not present

## 2020-03-16 DIAGNOSIS — Z7982 Long term (current) use of aspirin: Secondary | ICD-10-CM | POA: Insufficient documentation

## 2020-03-16 DIAGNOSIS — F17200 Nicotine dependence, unspecified, uncomplicated: Secondary | ICD-10-CM | POA: Diagnosis not present

## 2020-03-16 DIAGNOSIS — E669 Obesity, unspecified: Secondary | ICD-10-CM | POA: Insufficient documentation

## 2020-03-16 DIAGNOSIS — K219 Gastro-esophageal reflux disease without esophagitis: Secondary | ICD-10-CM | POA: Insufficient documentation

## 2020-03-16 DIAGNOSIS — I10 Essential (primary) hypertension: Secondary | ICD-10-CM | POA: Diagnosis not present

## 2020-03-16 DIAGNOSIS — Z881 Allergy status to other antibiotic agents status: Secondary | ICD-10-CM | POA: Insufficient documentation

## 2020-03-16 DIAGNOSIS — Z79899 Other long term (current) drug therapy: Secondary | ICD-10-CM | POA: Diagnosis not present

## 2020-03-16 DIAGNOSIS — R9439 Abnormal result of other cardiovascular function study: Secondary | ICD-10-CM | POA: Diagnosis present

## 2020-03-16 DIAGNOSIS — E785 Hyperlipidemia, unspecified: Secondary | ICD-10-CM

## 2020-03-16 DIAGNOSIS — I25118 Atherosclerotic heart disease of native coronary artery with other forms of angina pectoris: Secondary | ICD-10-CM | POA: Diagnosis not present

## 2020-03-16 HISTORY — PX: LEFT HEART CATH AND CORONARY ANGIOGRAPHY: CATH118249

## 2020-03-16 SURGERY — LEFT HEART CATH AND CORONARY ANGIOGRAPHY
Anesthesia: LOCAL

## 2020-03-16 MED ORDER — SODIUM CHLORIDE 0.9 % IV SOLN: 250.0000 mL | INTRAVENOUS | Status: DC | PRN

## 2020-03-16 MED ORDER — SODIUM CHLORIDE 0.9 % IV SOLN: INTRAVENOUS | Status: DC

## 2020-03-16 MED ORDER — ISOSORBIDE MONONITRATE ER 30 MG PO TB24: 30.0000 mg | ORAL_TABLET | Freq: Every day | ORAL | 11 refills | Status: DC

## 2020-03-16 MED ORDER — OXYCODONE HCL 5 MG PO TABS: 5.0000 mg | ORAL_TABLET | ORAL | Status: DC | PRN

## 2020-03-16 MED ORDER — SODIUM CHLORIDE 0.9% FLUSH: 3.0000 mL | INTRAVENOUS | Status: DC | PRN

## 2020-03-16 MED ORDER — FENTANYL CITRATE (PF) 100 MCG/2ML IJ SOLN
INTRAMUSCULAR | Status: AC
  Filled 2020-03-16: qty 2

## 2020-03-16 MED ORDER — HEPARIN SODIUM (PORCINE) 1000 UNIT/ML IJ SOLN
INTRAMUSCULAR | Status: AC
  Filled 2020-03-16: qty 1

## 2020-03-16 MED ORDER — LABETALOL HCL 5 MG/ML IV SOLN: 10.0000 mg | INTRAVENOUS | Status: DC | PRN

## 2020-03-16 MED ORDER — SODIUM CHLORIDE 0.9% FLUSH: 3.0000 mL | Freq: Two times a day (BID) | INTRAVENOUS | Status: DC

## 2020-03-16 MED ORDER — IOHEXOL 350 MG/ML SOLN
INTRAVENOUS | Status: DC | PRN
  Administered 2020-03-16: 70 mL via INTRA_ARTERIAL

## 2020-03-16 MED ORDER — ACETAMINOPHEN 325 MG PO TABS: 650.0000 mg | ORAL_TABLET | ORAL | Status: DC | PRN

## 2020-03-16 MED ORDER — ASPIRIN 81 MG PO CHEW: 81.0000 mg | CHEWABLE_TABLET | ORAL | Status: DC

## 2020-03-16 MED ORDER — SODIUM CHLORIDE 0.9 % WEIGHT BASED INFUSION: 1.0000 mL/kg/h | INTRAVENOUS | Status: DC

## 2020-03-16 MED ORDER — MIDAZOLAM HCL 2 MG/2ML IJ SOLN
INTRAMUSCULAR | Status: AC
  Filled 2020-03-16: qty 2

## 2020-03-16 MED ORDER — VERAPAMIL HCL 2.5 MG/ML IV SOLN
INTRAVENOUS | Status: AC
  Filled 2020-03-16: qty 2

## 2020-03-16 MED ORDER — FENTANYL CITRATE (PF) 100 MCG/2ML IJ SOLN
INTRAMUSCULAR | Status: DC | PRN
Start: 2020-03-16 — End: 2020-03-16
  Administered 2020-03-16: 50 ug via INTRAVENOUS

## 2020-03-16 MED ORDER — LIDOCAINE HCL (PF) 1 % IJ SOLN
INTRAMUSCULAR | Status: AC
  Filled 2020-03-16: qty 30

## 2020-03-16 MED ORDER — HEPARIN (PORCINE) IN NACL 1000-0.9 UT/500ML-% IV SOLN
INTRAVENOUS | Status: DC | PRN
  Administered 2020-03-16 (×2): 500 mL

## 2020-03-16 MED ORDER — ASPIRIN 81 MG PO CHEW: 81.0000 mg | CHEWABLE_TABLET | Freq: Every day | ORAL | Status: DC

## 2020-03-16 MED ORDER — LIDOCAINE HCL (PF) 1 % IJ SOLN
INTRAMUSCULAR | Status: DC | PRN
  Administered 2020-03-16: 2 mL via INTRADERMAL

## 2020-03-16 MED ORDER — HEPARIN (PORCINE) IN NACL 1000-0.9 UT/500ML-% IV SOLN
INTRAVENOUS | Status: AC
  Filled 2020-03-16: qty 1000

## 2020-03-16 MED ORDER — ROSUVASTATIN CALCIUM 20 MG PO TABS: 20.0000 mg | ORAL_TABLET | Freq: Every day | ORAL | 11 refills | Status: DC

## 2020-03-16 MED ORDER — HYDRALAZINE HCL 20 MG/ML IJ SOLN: 10.0000 mg | INTRAMUSCULAR | Status: DC | PRN

## 2020-03-16 MED ORDER — SODIUM CHLORIDE 0.9 % WEIGHT BASED INFUSION
3.0000 mL/kg/h | INTRAVENOUS | Status: AC
  Administered 2020-03-16: 3 mL/kg/h via INTRAVENOUS

## 2020-03-16 MED ORDER — HEPARIN SODIUM (PORCINE) 1000 UNIT/ML IJ SOLN
INTRAMUSCULAR | Status: DC | PRN
  Administered 2020-03-16: 6000 [IU] via INTRAVENOUS

## 2020-03-16 MED ORDER — MIDAZOLAM HCL 2 MG/2ML IJ SOLN
INTRAMUSCULAR | Status: DC | PRN
  Administered 2020-03-16: 1 mg via INTRAVENOUS

## 2020-03-16 MED ORDER — VERAPAMIL HCL 2.5 MG/ML IV SOLN
INTRAVENOUS | Status: DC | PRN
  Administered 2020-03-16: 10 mL via INTRA_ARTERIAL

## 2020-03-16 MED ORDER — ONDANSETRON HCL 4 MG/2ML IJ SOLN: 4.0000 mg | Freq: Four times a day (QID) | INTRAMUSCULAR | Status: DC | PRN

## 2020-03-16 SURGICAL SUPPLY — 11 items
CATH INFINITI 5 FR JL3.5 (CATHETERS) ×1 IMPLANT
CATH INFINITI JR4 5F (CATHETERS) ×1 IMPLANT
DEVICE RAD COMP TR BAND LRG (VASCULAR PRODUCTS) ×1 IMPLANT
GLIDESHEATH SLEND A-KIT 6F 22G (SHEATH) ×1 IMPLANT
GUIDEWIRE INQWIRE 1.5J.035X260 (WIRE) IMPLANT
INQWIRE 1.5J .035X260CM (WIRE) ×2
KIT HEART LEFT (KITS) ×2 IMPLANT
PACK CARDIAC CATHETERIZATION (CUSTOM PROCEDURE TRAY) ×2 IMPLANT
SHEATH PROBE COVER 6X72 (BAG) ×1 IMPLANT
TRANSDUCER W/STOPCOCK (MISCELLANEOUS) ×2 IMPLANT
TUBING CIL FLEX 10 FLL-RA (TUBING) ×2 IMPLANT

## 2020-03-16 NOTE — Interval H&P Note (Signed)
Cath Lab Visit (complete for each Cath Lab visit)  Clinical Evaluation Leading to the Procedure:   ACS: No.  Non-ACS:    Anginal Classification: CCS III  Anti-ischemic medical therapy: Minimal Therapy (1 class of medications)  Non-Invasive Test Results: Intermediate-risk stress test findings: cardiac mortality 1-3%/year  Prior CABG: No previous CABG      History and Physical Interval Note:  03/16/2020 7:10 AM  Dan Nelson  has presented today for surgery, with the diagnosis of angina.  The various methods of treatment have been discussed with the patient and family. After consideration of risks, benefits and other options for treatment, the patient has consented to  Procedure(s): LEFT HEART CATH AND CORONARY ANGIOGRAPHY (N/A) as a surgical intervention.  The patient's history has been reviewed, patient examined, no change in status, stable for surgery.  I have reviewed the patient's chart and labs.  Questions were answered to the patient's satisfaction.     Lyn Records III

## 2020-03-16 NOTE — Progress Notes (Signed)
Patient and wife was given discharge instructions. Both verbalized understanding. 

## 2020-03-16 NOTE — Discharge Instructions (Signed)
DRINK PLENTY OF FLUIDS FOR THE NEXT 2-3 DAYS.  KEEP ARM ELEVATED THE REMAINDER OF THE DAY.  Radial Site Care  This sheet gives you information about how to care for yourself after your procedure. Your health care provider may also give you more specific instructions. If you have problems or questions, contact your health care provider. What can I expect after the procedure? After the procedure, it is common to have:  Bruising and tenderness at the catheter insertion area. Follow these instructions at home: Medicines  Take over-the-counter and prescription medicines only as told by your health care provider. Insertion site care 1. Follow instructions from your health care provider about how to take care of your insertion site. Make sure you: ? Wash your hands with soap and water before you change your bandage (dressing). If soap and water are not available, use hand sanitizer. ? Change your dressing as told by your health care provider. 2. Check your insertion site every day for signs of infection. Check for: ? Redness, swelling, or pain. ? Fluid or blood. ? Pus or a bad smell. ? Warmth. 3. Do not take baths, swim, or use a hot tub for 5 days. 4. You may shower 24-48 hours after the procedure. ? Remove the dressing and gently wash the site with plain soap and water. ? Pat the area dry with a clean towel. ? Do not rub the site. That could cause bleeding. 5. Do not apply powder or lotion to the site. Activity  1. For 24 hours after the procedure, or as directed by your health care provider: ? Do not flex or bend the affected arm. ? Do not push or pull heavy objects with the affected arm. ? Do not drive yourself home from the hospital or clinic. You may drive 24 hours after the procedure. ? Do not operate machinery or power tools. 2. Do not push, pull or lift anything that is heavier than 10 lb for 5 days. 3. Ask your health care provider when it is okay to: ? Return to work or  school. ? Resume usual physical activities or sports. ? Resume sexual activity. General instructions  If the catheter site starts to bleed, raise your arm and put firm pressure on the site. If the bleeding does not stop, get help right away. This is a medical emergency.  If you went home on the same day as your procedure, a responsible adult should be with you for the first 24 hours after you arrive home.  Keep all follow-up visits as told by your health care provider. This is important. Contact a health care provider if:  You have a fever.  You have redness, swelling, or yellow drainage around your insertion site. Get help right away if:  You have unusual pain at the radial site.  The catheter insertion area swells very fast.  The insertion area is bleeding, and the bleeding does not stop when you hold steady pressure on the area.  Your arm or hand becomes pale, cool, tingly, or numb. These symptoms may represent a serious problem that is an emergency. Do not wait to see if the symptoms will go away. Get medical help right away. Call your local emergency services (911 in the [place] Do not drive yourself to the hospital. Summary  After the procedure, it is common to have bruising and tenderness at the site.  Follow instructions from your health care provider about how to take care of your radial site wound. Check   the wound every day for signs of infection.  Do not push, pull or lift anything that is heavier than 10 lb for 5 days.  This information is not intended to replace advice given to you by your health care provider. Make sure you discuss any questions you have with your health care provider. Document Revised: 07/25/2017 Document Reviewed: 07/25/2017 Elsevier Patient Education  2020 Elsevier Inc. 

## 2020-03-16 NOTE — CV Procedure (Addendum)
FINDINGS:  Severe multivessel coronary disease with predominantly mid to distal disease in the LAD and RCA.    Total occlusion of the dominant second obtuse marginal with collaterals from left to left.  Multiple diagonals are involved.  Severe Edema 1, 1, 1 mid to distal LAD bifurcation stenosis.  Small first diagonal contains ostial and mid vessel 70% stenosis.  Large of second diagonal contains mid vessel 70% stenosis at bifurcation.  RCA is dominant, contains a 60 to 65% eccentric mid vessel stenosis, followed by a large PDA which contains 95% stenosis in the distal third and supplies the apex.  There is also a very large left ventricular branch that is free of disease.  Left main is widely patent.  Nuclear study demonstrated inferobasal mid and distal inferior wall ischemia, suggesting that the intermediate stenosis in the RCA is hemodynamically significant.  Normal LV function.  EF is 65%.  LVEDP is normal.  RECOMMENDATIONS:   TCTS consult to determine if the patient is a surgical candidate.  The obtuse marginal, third diagonal, and distal right coronary territories are graftable.  Nuclear study performed prior to admission suggesting Start Imdur and statin therapy.

## 2020-03-19 NOTE — Telephone Encounter (Signed)
Follow up:     Patient returning a call back. Please call patient. [phone]

## 2020-03-19 NOTE — Telephone Encounter (Signed)
Spoke with pt he states that the previous message was left to remind him of his upcoming echo.

## 2020-03-24 ENCOUNTER — Ambulatory Visit (INDEPENDENT_AMBULATORY_CARE_PROVIDER_SITE_OTHER): Payer: Commercial Managed Care - PPO

## 2020-03-24 ENCOUNTER — Other Ambulatory Visit: Payer: Self-pay

## 2020-03-24 DIAGNOSIS — R011 Cardiac murmur, unspecified: Secondary | ICD-10-CM

## 2020-03-24 DIAGNOSIS — R0789 Other chest pain: Secondary | ICD-10-CM | POA: Diagnosis not present

## 2020-03-24 LAB — ECHOCARDIOGRAM COMPLETE
Area-P 1/2: 2.8 cm2
S' Lateral: 3.6 cm

## 2020-03-24 NOTE — Progress Notes (Signed)
Complete echocardiogram performed.  Jimmy Petro Talent RDCS, RVT  

## 2020-03-26 NOTE — Telephone Encounter (Signed)
How do you advise?

## 2020-03-26 NOTE — Telephone Encounter (Signed)
Pt is inquiring on when he should expect a call from the surgeon. I see in your cath report that you were consulting with TCTS. Pt is concerned that he has heard nothing and we are unable to advise him. Thank you, Adham Johnson,RN  

## 2020-03-31 NOTE — Telephone Encounter (Incomplete)
Pt is inquiring on when he should expect a call from the surgeon. I see in your cath report that you were consulting with TCTS. Pt is concerned that he has heard nothing and we are unable to advise him. Thank you, Lester [place]

## 2020-04-01 ENCOUNTER — Encounter (HOSPITAL_COMMUNITY): Payer: Self-pay | Admitting: Interventional Cardiology

## 2020-04-02 NOTE — Telephone Encounter (Signed)
Pt is scheduled to see [name] on 10/6.

## 2020-04-07 ENCOUNTER — Other Ambulatory Visit: Payer: Self-pay

## 2020-04-07 ENCOUNTER — Encounter: Payer: Self-pay | Admitting: Cardiothoracic Surgery

## 2020-04-07 ENCOUNTER — Other Ambulatory Visit: Payer: Self-pay | Admitting: *Deleted

## 2020-04-07 ENCOUNTER — Institutional Professional Consult (permissible substitution) (INDEPENDENT_AMBULATORY_CARE_PROVIDER_SITE_OTHER): Payer: Commercial Managed Care - PPO | Admitting: Cardiothoracic Surgery

## 2020-04-07 ENCOUNTER — Encounter: Payer: Self-pay | Admitting: *Deleted

## 2020-04-07 DIAGNOSIS — I2510 Atherosclerotic heart disease of native coronary artery without angina pectoris: Secondary | ICD-10-CM

## 2020-04-07 DIAGNOSIS — I25118 Atherosclerotic heart disease of native coronary artery with other forms of angina pectoris: Secondary | ICD-10-CM

## 2020-04-07 DIAGNOSIS — I25119 Atherosclerotic heart disease of native coronary artery with unspecified angina pectoris: Secondary | ICD-10-CM | POA: Diagnosis not present

## 2020-04-07 HISTORY — DX: Atherosclerotic heart disease of native coronary artery without angina pectoris: I25.10

## 2020-04-07 NOTE — Progress Notes (Signed)
PCP is Ronal Fear, NP Referring Provider is Lyn Records, MD  Chief Complaint  Patient presents with  . Coronary Artery Disease    new patient constultation, ECHO 03/24/20, CATH 03/16/20  . New Patient (Initial Visit)    HPI: Patient referred for evaluation for CABG for recent diagnosis of multivessel CAD.  Patient had a episode of exertional chest pain and underwent stress test.  This showed inferior lateral ischemia.  Subsequent cardiac catheterization by Dr. Verdis Prime demonstrates occlusion of a large OM1 with high-grade stenosis of the LAD diagonal, high-grade stenosis of the mid to distal posterior descending and preserved LV function by echo.  The patient has had no significant recurrent pain.  He has a strong family history of heart disease hypertension and stroke.  He is a nondiabetic.  He has had no previous surgical procedures or general anesthesia.  He has a sinus rhythm and no significant valve disease on echo.   Past Medical History:  Diagnosis Date  . Benign essential hypertension   . 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 GERD without esophagitis   . Hyperlipidemia   . Liver enzyme elevation   . Obesity   . Spinal stenosis, lumbar   . Statin intolerance     Past Surgical History:  Procedure Laterality Date  . Cosmetic ear surgery bilateral, childhood    . LEFT HEART CATH AND CORONARY ANGIOGRAPHY N/A 03/16/2020   Procedure: LEFT HEART CATH AND CORONARY ANGIOGRAPHY;  Surgeon: Lyn Records, MD;  Location: [facility];  Service: Cardiovascular;  Laterality: N/A;  . 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 Hypercholesterolemia Mother   . CAD Mother   . Heart attack Mother   . CAD Father   . Heart attack Father   . Stroke Father   . Hypercholesterolemia Brother     Social History Social History   Tobacco Use  . Smoking status: Unknown If Ever Smoked  .  Smokeless tobacco: Current User    Types: Snuff  Substance Use Topics  . Alcohol use: Not on file  . Drug use: Not on file    Current Outpatient Medications  Medication Sig Dispense Refill  . aspirin 81 MG EC tablet Take 81 mg by mouth daily.    . cholecalciferol (VITAMIN D3) 25 MCG (1000 UNIT) tablet Take 1,000 Units by mouth daily.    Marland Kitchen ezetimibe (ZETIA) 10 MG tablet Take 10 mg by mouth daily.    . hydrochlorothiazide (HYDRODIURIL) 12.5 MG tablet Take 12.5 mg by mouth daily.    . isosorbide mononitrate (IMDUR) 30 MG 24 hr tablet Take 1 tablet (30 mg total) by mouth daily. 30 tablet 11  . lisinopril (ZESTRIL) 40 MG tablet Take 40 mg by mouth daily.    . nitroGLYCERIN (NITROSTAT) 0.4 MG SL tablet Place 1 tablet (0.4 mg total) under the tongue every 5 (five) minutes as needed. 25 tablet 6  . omeprazole (PRILOSEC) 20 MG capsule Take 20 mg by mouth as needed.     . polycarbophil (FIBERCON) 625 MG tablet Take 625 mg by mouth daily.    . rosuvastatin (CRESTOR) 20 MG tablet Take 1 tablet (20 mg total) by mouth daily. 30 tablet 11   No current facility-administered medications for this visit.    Allergies  Allergen Reactions  . Penicillins Swelling and Rash       . Augmentin [  Amoxicillin-Pot Clavulanate] Swelling  . Crestor [Rosuvastatin]   . Zocor [Simvastatin]     Review of Systems  Patient is right-hand dominant No history of thoracic trauma or rib fractures or pneumothorax He is a non-smoker No history of TIA or syncope Upper dental plate, no dental symptoms of his lower teeth Weight has been stable this summer No history of DVT or varicose veins No history of bleeding disorder blood transfusion Patient had Covid December 2020 and a vaccination May 2021 BP 124/71 (BP Location: Left Arm, Patient Position: Sitting, Cuff Size: Large)   Pulse 66   Temp 98.9 F (37.2 C)   Resp 18   Ht 6\' 1"  (1.854 m)   Wt 259 lb 12.8 oz (117.8 kg)   SpO2 95% Comment: RA  BMI 34.28 kg/m   Physical Exam     Physical Exam  General: Obese middle-aged male no acute distress HEENT: Normocephalic pupils equal , dentition adequate Neck: Supple without JVD, adenopathy, or bruit Chest: Clear to auscultation, symmetrical breath sounds, no rhonchi, no tenderness             or deformity Cardiovascular: Regular rate and rhythm, no murmur, no gallop, peripheral pulses             palpable in all extremities Abdomen:  Soft, obese, nontender, no palpable mass or organomegaly Extremities: Warm, well-perfused, no clubbing cyanosis edema or tenderness,              no venous stasis changes of the legs Rectal/GU: Deferred Neuro: Grossly non--focal and symmetrical throughout Skin: Clean and dry without rash or ulceration   Diagnostic Tests: Coronary angiograms reviewed with disease as described above.  Impression: Symptomatic severe three-vessel coronary disease Hypertension Obesity History of low back pain and spinal injections   Plan: Patient will be prepared for multivessel CABG on October 14 at [facility].  I have discussed the details of the surgery including the expected benefits and risks and he understands and agrees to proceed.   [facility], MD [facility] [phone]

## 2020-04-12 NOTE — Progress Notes (Signed)
[id number] - [place], [place] - [address] AT [facility] [address] [place] [address] Phone: [phone] Fax: [phone]      Your procedure is scheduled on 04/15/20.  Report to [facility] Main Entrance "A" at 5:30 A.M., and check in at the Admitting office.  Call this number if you have problems the morning of surgery:  [phone]  Call [phone] if you have any questions prior to your surgery date Monday-Friday 8am-4pm    Remember:  Do not eat or drink after midnight the night before your surgery    Take these medicines the morning of surgery with A SIP OF WATER: ezetimibe (ZETIA) isosorbide mononitrate (IMDUR)   IF NEEDED:  nitroGLYCERIN (NITROSTAT)  omeprazole (PRILOSEC)   As of today, STOP taking any Aspirin (unless otherwise instructed by your surgeon) Aleve, Naproxen, Ibuprofen, Motrin, Advil, Goody's, BC's, all herbal medications, fish oil, and all vitamins.                      Do not wear jewelry            Do not wear lotions, powders, colognes, or deodorant.            Do not shave 48 hours prior to surgery.  Men may shave face and neck.            Do not bring valuables to the hospital.            [facility] is not responsible for any belongings or valuables.  Do NOT Smoke (Tobacco/Vaping) or drink Alcohol 24 hours prior to your procedure If you use a CPAP at night, you may bring all equipment for your overnight stay.   Contacts, glasses, dentures or bridgework may not be worn into surgery.      For patients admitted to the hospital, discharge time will be determined by your treatment team.   Patients discharged the day of surgery will not be allowed to drive home, and someone needs to stay with them for 24 hours.    Special instructions:   [facility]- Preparing For Surgery  Before surgery, you can play an important role. Because skin is not sterile, your skin needs to be as free of germs as possible.  You can reduce the number of germs on your skin by washing with CHG (chlorahexidine gluconate) Soap before surgery.  CHG is an antiseptic cleaner which kills germs and bonds with the skin to continue killing germs even after washing.    Oral Hygiene is also important to reduce your risk of infection.  Remember - BRUSH YOUR TEETH THE MORNING OF SURGERY WITH YOUR REGULAR TOOTHPASTE  Please do not use if you have an allergy to CHG or antibacterial soaps. If your skin becomes reddened/irritated stop using the CHG.  Do not shave (including legs and underarms) for at least 48 hours prior to first CHG shower. It is OK to shave your face.  Please follow these instructions carefully.   1. Shower the NIGHT BEFORE SURGERY and the MORNING OF SURGERY with CHG Soap.   2. If you chose to wash your hair, wash your hair first as usual with your normal shampoo.  3. After you shampoo, rinse your hair and body thoroughly to remove the shampoo.  4. Use CHG as you would any other liquid soap. You can apply CHG directly to the skin and wash gently with a scrungie or a clean  washcloth.   5. Apply the CHG Soap to your body ONLY FROM THE NECK DOWN.  Do not use on open wounds or open sores. Avoid contact with your eyes, ears, mouth and genitals (private parts). Wash Face and genitals (private parts)  with your normal soap.   6. Wash thoroughly, paying special attention to the area where your surgery will be performed.  7. Thoroughly rinse your body with warm water from the neck down.  8. DO NOT shower/wash with your normal soap after using and rinsing off the CHG Soap.  9. Pat yourself dry with a CLEAN TOWEL.  10. Wear CLEAN PAJAMAS to bed the night before surgery  11. Place CLEAN SHEETS on your bed the night of your first shower and DO NOT SLEEP WITH PETS.   Day of Surgery: Wear Clean/Comfortable clothing the morning of surgery Do not apply any deodorants/lotions.   Remember to brush your teeth WITH YOUR  REGULAR TOOTHPASTE.   Please read over the following fact sheets that you were given.

## 2020-04-13 ENCOUNTER — Other Ambulatory Visit: Payer: Self-pay

## 2020-04-13 ENCOUNTER — Other Ambulatory Visit (HOSPITAL_COMMUNITY)
Admission: RE | Admit: 2020-04-13 | Discharge: 2020-04-13 | Disposition: A | Discharge status: Home / Self Care | Payer: Commercial Managed Care - PPO | Source: Ambulatory Visit | Attending: Cardiothoracic Surgery | Admitting: Cardiothoracic Surgery

## 2020-04-13 ENCOUNTER — Ambulatory Visit (HOSPITAL_COMMUNITY)
Admission: RE | Admit: 2020-04-13 | Discharge: 2020-04-13 | Disposition: A | Discharge status: Home / Self Care | Payer: Commercial Managed Care - PPO | Source: Ambulatory Visit | Attending: Cardiothoracic Surgery | Admitting: Cardiothoracic Surgery

## 2020-04-13 ENCOUNTER — Encounter (HOSPITAL_COMMUNITY): Payer: Self-pay

## 2020-04-13 ENCOUNTER — Encounter (HOSPITAL_COMMUNITY)
Admission: RE | Admit: 2020-04-13 | Discharge: 2020-04-13 | Disposition: A | Discharge status: Home / Self Care | Payer: Commercial Managed Care - PPO | Source: Ambulatory Visit | Attending: Cardiothoracic Surgery | Admitting: Cardiothoracic Surgery

## 2020-04-13 DIAGNOSIS — I2510 Atherosclerotic heart disease of native coronary artery without angina pectoris: Secondary | ICD-10-CM | POA: Insufficient documentation

## 2020-04-13 DIAGNOSIS — Z01812 Encounter for preprocedural laboratory examination: Secondary | ICD-10-CM | POA: Insufficient documentation

## 2020-04-13 DIAGNOSIS — Z20822 Contact with and (suspected) exposure to covid-19: Secondary | ICD-10-CM | POA: Insufficient documentation

## 2020-04-13 DIAGNOSIS — Z736 Limitation of activities due to disability: Secondary | ICD-10-CM

## 2020-04-13 HISTORY — DX: Atherosclerotic heart disease of native coronary artery without angina pectoris: I25.10

## 2020-04-13 HISTORY — DX: Dyspnea, unspecified: R06.00

## 2020-04-13 LAB — BLOOD GAS, ARTERIAL
Acid-Base Excess: 0 mmol/L (ref 0.0–2.0)
Bicarbonate: 23.9 mmol/L (ref 20.0–28.0)
FIO2: 21
O2 Saturation: 96.5 %
Patient temperature: 37
pCO2 arterial: 36.8 mmHg (ref 32.0–48.0)
pH, Arterial: 7.428 (ref 7.350–7.450)
pO2, Arterial: 85.8 mmHg (ref 83.0–108.0)

## 2020-04-13 LAB — HEMOGLOBIN A1C
Hgb A1c MFr Bld: 5.5 % (ref 4.8–5.6)
Mean Plasma Glucose: 111.15 mg/dL

## 2020-04-13 LAB — SARS CORONAVIRUS 2 (TAT 6-24 HRS): SARS Coronavirus 2: NEGATIVE

## 2020-04-13 LAB — URINALYSIS, ROUTINE W REFLEX MICROSCOPIC
Bacteria, UA: NONE SEEN
Bilirubin Urine: NEGATIVE
Glucose, UA: NEGATIVE mg/dL
Ketones, ur: NEGATIVE mg/dL
Leukocytes,Ua: NEGATIVE
Nitrite: NEGATIVE
Protein, ur: NEGATIVE mg/dL
Specific Gravity, Urine: 1.01 (ref 1.005–1.030)
pH: 5 (ref 5.0–8.0)

## 2020-04-13 LAB — COMPREHENSIVE METABOLIC PANEL
ALT: 47 U/L — ABNORMAL HIGH (ref 0–44)
AST: 31 U/L (ref 15–41)
Albumin: 4 g/dL (ref 3.5–5.0)
Alkaline Phosphatase: 47 U/L (ref 38–126)
Anion gap: 11 (ref 5–15)
BUN: 16 mg/dL (ref 8–23)
CO2: 20 mmol/L — ABNORMAL LOW (ref 22–32)
Calcium: 9.3 mg/dL (ref 8.9–10.3)
Chloride: 106 mmol/L (ref 98–111)
Creatinine, Ser: 1.18 mg/dL (ref 0.61–1.24)
GFR, Estimated: >60 mL/min (ref >60)
Glucose, Bld: 95 mg/dL (ref 70–99)
Potassium: 4.1 mmol/L (ref 3.5–5.1)
Sodium: 137 mmol/L (ref 135–145)
Total Bilirubin: 0.7 mg/dL (ref 0.3–1.2)
Total Protein: 7 g/dL (ref 6.5–8.1)

## 2020-04-13 LAB — TYPE AND SCREEN
ABO/RH(D): A POS
Antibody Screen: NEGATIVE

## 2020-04-13 LAB — APTT: aPTT: 28 seconds (ref 24–36)

## 2020-04-13 LAB — CBC
HCT: 49.9 % (ref 39.0–52.0)
Hemoglobin: 17.4 g/dL — ABNORMAL HIGH (ref 13.0–17.0)
MCH: 32.7 pg (ref 26.0–34.0)
MCHC: 34.9 g/dL (ref 30.0–36.0)
MCV: 93.8 fL (ref 80.0–100.0)
Platelets: 241 10*3/uL (ref 150–400)
RBC: 5.32 MIL/uL (ref 4.22–5.81)
RDW: 11.8 % (ref 11.5–15.5)
WBC: 9 10*3/uL (ref 4.0–10.5)
nRBC: 0 % (ref 0.0–0.2)

## 2020-04-13 LAB — PROTIME-INR
INR: 1.1 (ref 0.8–1.2)
Prothrombin Time: 13.3 seconds (ref 11.4–15.2)

## 2020-04-13 NOTE — Progress Notes (Signed)
PRE CABG has been completed.   Preliminary results in CV Proc.   Blanch Media 04/13/2020 12:58 PM

## 2020-04-14 ENCOUNTER — Ambulatory Visit (HOSPITAL_COMMUNITY)
Admission: RE | Admit: 2020-04-14 | Discharge: 2020-04-14 | Disposition: A | Discharge status: Home / Self Care | Payer: Commercial Managed Care - PPO | Source: Ambulatory Visit | Attending: Cardiothoracic Surgery | Admitting: Cardiothoracic Surgery

## 2020-04-14 ENCOUNTER — Telehealth: Payer: Self-pay | Admitting: Cardiology

## 2020-04-14 DIAGNOSIS — I2510 Atherosclerotic heart disease of native coronary artery without angina pectoris: Secondary | ICD-10-CM | POA: Insufficient documentation

## 2020-04-14 LAB — PULMONARY FUNCTION TEST
DL/VA % pred: 98 %
DL/VA: 3.89 ml/min/mmHg/L
DLCO cor % pred: 84 %
DLCO cor: 22.77 ml/min/mmHg
DLCO unc % pred: 93 %
DLCO unc: 25.15 ml/min/mmHg
FEF 25-75 Post: 2.72 L/sec
FEF 25-75 Pre: 2.99 L/sec
FEF2575-%Change-Post: -9 %
FEF2575-%Pred-Post: 94 %
FEF2575-%Pred-Pre: 104 %
FEV1-%Change-Post: -2 %
FEV1-%Pred-Post: 94 %
FEV1-%Pred-Pre: 96 %
FEV1-Post: 3.25 L
FEV1-Pre: 3.32 L
FEV1FVC-%Change-Post: 3 %
FEV1FVC-%Pred-Pre: 100 %
FEV6-%Change-Post: -5 %
FEV6-%Pred-Post: 93 %
FEV6-%Pred-Pre: 98 %
FEV6-Post: 4.03 L
FEV6-Pre: 4.25 L
FEV6FVC-%Pred-Post: 103 %
FEV6FVC-%Pred-Pre: 103 %
FVC-%Change-Post: -5 %
FVC-%Pred-Post: 90 %
FVC-%Pred-Pre: 95 %
FVC-Post: 4.03 L
FVC-Pre: 4.25 L
Post FEV1/FVC ratio: 81 %
Post FEV6/FVC ratio: 100 %
Pre FEV1/FVC ratio: 78 %
Pre FEV6/FVC Ratio: 100 %
RV % pred: 113 %
RV: 2.79 L
TLC % pred: 113 %
TLC: 7.3 L

## 2020-04-14 MED ORDER — MAGNESIUM SULFATE 50 % IJ SOLN
40.0000 meq | INTRAMUSCULAR | Status: DC
  Filled 2020-04-14: qty 9.85

## 2020-04-14 MED ORDER — NOREPINEPHRINE 4 MG/250ML-% IV SOLN
0.0000 ug/min | INTRAVENOUS | Status: DC
  Filled 2020-04-14: qty 250

## 2020-04-14 MED ORDER — TRANEXAMIC ACID 1000 MG/10ML IV SOLN
1.5000 mg/kg/h | INTRAVENOUS | Status: AC
  Administered 2020-04-15: 1.5 mg/kg/h via INTRAVENOUS
  Filled 2020-04-14: qty 25

## 2020-04-14 MED ORDER — PHENYLEPHRINE HCL-NACL 20-0.9 MG/250ML-% IV SOLN
30.0000 ug/min | INTRAVENOUS | Status: AC
  Administered 2020-04-15: 20 ug/min via INTRAVENOUS
  Filled 2020-04-14: qty 250

## 2020-04-14 MED ORDER — INSULIN REGULAR(HUMAN) IN NACL 100-0.9 UT/100ML-% IV SOLN
INTRAVENOUS | Status: AC
  Administered 2020-04-15: 1.1 [IU]/h via INTRAVENOUS
  Filled 2020-04-14: qty 100

## 2020-04-14 MED ORDER — VANCOMYCIN HCL 1500 MG/300ML IV SOLN
1500.0000 mg | INTRAVENOUS | Status: AC
  Administered 2020-04-15: 1500 mg via INTRAVENOUS
  Filled 2020-04-14: qty 300

## 2020-04-14 MED ORDER — ALBUTEROL SULFATE (2.5 MG/3ML) 0.083% IN NEBU
2.5000 mg | INHALATION_SOLUTION | Freq: Once | RESPIRATORY_TRACT | Status: AC
  Administered 2020-04-14: 2.5 mg via RESPIRATORY_TRACT

## 2020-04-14 MED ORDER — DEXMEDETOMIDINE HCL IN NACL 400 MCG/100ML IV SOLN
0.1000 ug/kg/h | INTRAVENOUS | Status: AC
  Administered 2020-04-15: 12 ug via INTRAVENOUS
  Administered 2020-04-15: .5 ug/kg/h via INTRAVENOUS
  Filled 2020-04-14: qty 100

## 2020-04-14 MED ORDER — PLASMA-LYTE 148 IV SOLN
INTRAVENOUS | Status: DC
  Filled 2020-04-14: qty 2.5

## 2020-04-14 MED ORDER — NITROGLYCERIN IN D5W 200-5 MCG/ML-% IV SOLN
2.0000 ug/min | INTRAVENOUS | Status: DC
  Filled 2020-04-14: qty 250

## 2020-04-14 MED ORDER — POTASSIUM CHLORIDE 2 MEQ/ML IV SOLN
80.0000 meq | INTRAVENOUS | Status: DC
  Filled 2020-04-14: qty 40

## 2020-04-14 MED ORDER — TRANEXAMIC ACID (OHS) PUMP PRIME SOLUTION
2.0000 mg/kg | INTRAVENOUS | Status: DC
  Filled 2020-04-14: qty 2.36

## 2020-04-14 MED ORDER — MILRINONE LACTATE IN DEXTROSE 20-5 MG/100ML-% IV SOLN
0.3000 ug/kg/min | INTRAVENOUS | Status: DC
  Filled 2020-04-14: qty 100

## 2020-04-14 MED ORDER — LEVOFLOXACIN IN D5W 500 MG/100ML IV SOLN
500.0000 mg | INTRAVENOUS | Status: AC
  Administered 2020-04-15: 500 mg via INTRAVENOUS
  Filled 2020-04-14: qty 100

## 2020-04-14 MED ORDER — SODIUM CHLORIDE 0.9 % IV SOLN
INTRAVENOUS | Status: DC
  Filled 2020-04-14: qty 30

## 2020-04-14 MED ORDER — TRANEXAMIC ACID (OHS) BOLUS VIA INFUSION
15.0000 mg/kg | INTRAVENOUS | Status: AC
  Administered 2020-04-15: 1770 mg via INTRAVENOUS
  Filled 2020-04-14: qty 1770

## 2020-04-14 MED ORDER — EPINEPHRINE HCL 5 MG/250ML IV SOLN IN NS
0.0000 ug/min | INTRAVENOUS | Status: DC
  Filled 2020-04-14: qty 250

## 2020-04-14 NOTE — Telephone Encounter (Signed)
Patient is having open heart surgery tomorrow. He states he got a call from our office in regards to disability forms and a $29 charge. I don't see any notes about anyone calling him. He says he sent in disability forms to Dr. Kathlee Nations Trigt's office but didn't know if he needed to send them to [place] as well.

## 2020-04-14 NOTE — Telephone Encounter (Signed)
This is the message that came in 53 mins ago. I am not sure if this was before or after your call.

## 2020-04-15 ENCOUNTER — Inpatient Hospital Stay (HOSPITAL_COMMUNITY): Payer: Commercial Managed Care - PPO | Admitting: Physician Assistant

## 2020-04-15 ENCOUNTER — Inpatient Hospital Stay (HOSPITAL_COMMUNITY): Payer: Commercial Managed Care - PPO

## 2020-04-15 ENCOUNTER — Encounter (HOSPITAL_COMMUNITY): Payer: Self-pay | Admitting: Cardiothoracic Surgery

## 2020-04-15 ENCOUNTER — Inpatient Hospital Stay (HOSPITAL_COMMUNITY)
Admission: RE | Admit: 2020-04-15 | Discharge: 2020-04-23 | DRG: 236 | Disposition: A | Discharge status: Home / Self Care | Payer: Commercial Managed Care - PPO | Attending: Cardiothoracic Surgery | Admitting: Cardiothoracic Surgery

## 2020-04-15 ENCOUNTER — Inpatient Hospital Stay (HOSPITAL_COMMUNITY)
Admission: RE | Disposition: A | Discharge status: Home / Self Care | Payer: Self-pay | Source: Home / Self Care | Attending: Cardiothoracic Surgery

## 2020-04-15 DIAGNOSIS — Z8249 Family history of ischemic heart disease and other diseases of the circulatory system: Secondary | ICD-10-CM

## 2020-04-15 DIAGNOSIS — Z20822 Contact with and (suspected) exposure to covid-19: Secondary | ICD-10-CM | POA: Diagnosis present

## 2020-04-15 DIAGNOSIS — F17220 Nicotine dependence, chewing tobacco, uncomplicated: Secondary | ICD-10-CM | POA: Diagnosis present

## 2020-04-15 DIAGNOSIS — Z951 Presence of aortocoronary bypass graft: Secondary | ICD-10-CM

## 2020-04-15 DIAGNOSIS — Z880 Allergy status to penicillin: Secondary | ICD-10-CM

## 2020-04-15 DIAGNOSIS — Z888 Allergy status to other drugs, medicaments and biological substances status: Secondary | ICD-10-CM | POA: Diagnosis not present

## 2020-04-15 DIAGNOSIS — Z419 Encounter for procedure for purposes other than remedying health state, unspecified: Secondary | ICD-10-CM

## 2020-04-15 DIAGNOSIS — E8770 Fluid overload, unspecified: Secondary | ICD-10-CM | POA: Diagnosis not present

## 2020-04-15 DIAGNOSIS — R339 Retention of urine, unspecified: Secondary | ICD-10-CM | POA: Diagnosis not present

## 2020-04-15 DIAGNOSIS — E785 Hyperlipidemia, unspecified: Secondary | ICD-10-CM | POA: Diagnosis not present

## 2020-04-15 DIAGNOSIS — Z23 Encounter for immunization: Secondary | ICD-10-CM | POA: Diagnosis not present

## 2020-04-15 DIAGNOSIS — I10 Essential (primary) hypertension: Secondary | ICD-10-CM | POA: Diagnosis present

## 2020-04-15 DIAGNOSIS — K219 Gastro-esophageal reflux disease without esophagitis: Secondary | ICD-10-CM | POA: Diagnosis not present

## 2020-04-15 DIAGNOSIS — Z6835 Body mass index (BMI) 35.0-35.9, adult: Secondary | ICD-10-CM

## 2020-04-15 DIAGNOSIS — M48061 Spinal stenosis, lumbar region without neurogenic claudication: Secondary | ICD-10-CM | POA: Diagnosis present

## 2020-04-15 DIAGNOSIS — E669 Obesity, unspecified: Secondary | ICD-10-CM | POA: Diagnosis not present

## 2020-04-15 DIAGNOSIS — Z83438 Family history of other disorder of lipoprotein metabolism and other lipidemia: Secondary | ICD-10-CM

## 2020-04-15 DIAGNOSIS — I480 Paroxysmal atrial fibrillation: Secondary | ICD-10-CM | POA: Diagnosis present

## 2020-04-15 DIAGNOSIS — I25110 Atherosclerotic heart disease of native coronary artery with unstable angina pectoris: Secondary | ICD-10-CM | POA: Diagnosis present

## 2020-04-15 DIAGNOSIS — I2510 Atherosclerotic heart disease of native coronary artery without angina pectoris: Secondary | ICD-10-CM

## 2020-04-15 DIAGNOSIS — I4891 Unspecified atrial fibrillation: Secondary | ICD-10-CM

## 2020-04-15 HISTORY — DX: Presence of aortocoronary bypass graft: Z95.1

## 2020-04-15 HISTORY — PX: CORONARY ARTERY BYPASS GRAFT: SHX141

## 2020-04-15 HISTORY — PX: TEE WITHOUT CARDIOVERSION: SHX5443

## 2020-04-15 LAB — POCT I-STAT 7, (LYTES, BLD GAS, ICA,H+H)
Acid-Base Excess: 1 mmol/L (ref 0.0–2.0)
Acid-base deficit: 1 mmol/L (ref 0.0–2.0)
Acid-base deficit: 2 mmol/L (ref 0.0–2.0)
Acid-base deficit: 3 mmol/L — ABNORMAL HIGH (ref 0.0–2.0)
Acid-base deficit: 4 mmol/L — ABNORMAL HIGH (ref 0.0–2.0)
Acid-base deficit: 5 mmol/L — ABNORMAL HIGH (ref 0.0–2.0)
Acid-base deficit: 3 mmol/L — ABNORMAL HIGH (ref 0.0–2.0)
Acid-base deficit: 4 mmol/L — ABNORMAL HIGH (ref 0.0–2.0)
Bicarbonate: 26.7 mmol/L (ref 20.0–28.0)
Bicarbonate: 27.6 mmol/L (ref 20.0–28.0)
Bicarbonate: 23.2 mmol/L (ref 20.0–28.0)
Bicarbonate: 20.8 mmol/L (ref 20.0–28.0)
Bicarbonate: 21.9 mmol/L (ref 20.0–28.0)
Bicarbonate: 21.2 mmol/L (ref 20.0–28.0)
Bicarbonate: 22.3 mmol/L (ref 20.0–28.0)
Bicarbonate: 20.9 mmol/L (ref 20.0–28.0)
Calcium, Ion: 1.06 mmol/L — ABNORMAL LOW (ref 1.15–1.40)
Calcium, Ion: 1.1 mmol/L — ABNORMAL LOW (ref 1.15–1.40)
Calcium, Ion: 1.04 mmol/L — ABNORMAL LOW (ref 1.15–1.40)
Calcium, Ion: 1.1 mmol/L — ABNORMAL LOW (ref 1.15–1.40)
Calcium, Ion: 1.11 mmol/L — ABNORMAL LOW (ref 1.15–1.40)
Calcium, Ion: 1.15 mmol/L (ref 1.15–1.40)
Calcium, Ion: 1.12 mmol/L — ABNORMAL LOW (ref 1.15–1.40)
Calcium, Ion: 1.13 mmol/L — ABNORMAL LOW (ref 1.15–1.40)
HCT: 35 % — ABNORMAL LOW (ref 39.0–52.0)
HCT: 36 % — ABNORMAL LOW (ref 39.0–52.0)
HCT: 37 % — ABNORMAL LOW (ref 39.0–52.0)
HCT: 41 % (ref 39.0–52.0)
HCT: 35 % — ABNORMAL LOW (ref 39.0–52.0)
HCT: 41 % (ref 39.0–52.0)
HCT: 43 % (ref 39.0–52.0)
HCT: 42 % (ref 39.0–52.0)
Hemoglobin: 11.9 g/dL — ABNORMAL LOW (ref 13.0–17.0)
Hemoglobin: 12.2 g/dL — ABNORMAL LOW (ref 13.0–17.0)
Hemoglobin: 12.6 g/dL — ABNORMAL LOW (ref 13.0–17.0)
Hemoglobin: 13.9 g/dL (ref 13.0–17.0)
Hemoglobin: 11.9 g/dL — ABNORMAL LOW (ref 13.0–17.0)
Hemoglobin: 13.9 g/dL (ref 13.0–17.0)
Hemoglobin: 14.6 g/dL (ref 13.0–17.0)
Hemoglobin: 14.3 g/dL (ref 13.0–17.0)
O2 Saturation: 100 %
O2 Saturation: 79 %
O2 Saturation: 100 %
O2 Saturation: 100 %
O2 Saturation: 99 %
O2 Saturation: 97 %
O2 Saturation: 96 %
O2 Saturation: 96 %
Patient temperature: 36.9
Patient temperature: 37.5
Potassium: 5.3 mmol/L — ABNORMAL HIGH (ref 3.5–5.1)
Potassium: 5.3 mmol/L — ABNORMAL HIGH (ref 3.5–5.1)
Potassium: 6.5 mmol/L (ref 3.5–5.1)
Potassium: 5.6 mmol/L — ABNORMAL HIGH (ref 3.5–5.1)
Potassium: 4.9 mmol/L (ref 3.5–5.1)
Potassium: 5.4 mmol/L — ABNORMAL HIGH (ref 3.5–5.1)
Potassium: 5.2 mmol/L — ABNORMAL HIGH (ref 3.5–5.1)
Potassium: 4.8 mmol/L (ref 3.5–5.1)
Sodium: 136 mmol/L (ref 135–145)
Sodium: 137 mmol/L (ref 135–145)
Sodium: 127 mmol/L — ABNORMAL LOW (ref 135–145)
Sodium: 133 mmol/L — ABNORMAL LOW (ref 135–145)
Sodium: 136 mmol/L (ref 135–145)
Sodium: 136 mmol/L (ref 135–145)
Sodium: 139 mmol/L (ref 135–145)
Sodium: 140 mmol/L (ref 135–145)
TCO2: 28 mmol/L (ref 22–32)
TCO2: 29 mmol/L (ref 22–32)
TCO2: 24 mmol/L (ref 22–32)
TCO2: 22 mmol/L (ref 22–32)
TCO2: 23 mmol/L (ref 22–32)
TCO2: 23 mmol/L (ref 22–32)
TCO2: 23 mmol/L (ref 22–32)
TCO2: 22 mmol/L (ref 22–32)
pCO2 arterial: 53 mmHg — ABNORMAL HIGH (ref 32.0–48.0)
pCO2 arterial: 54.8 mmHg — ABNORMAL HIGH (ref 32.0–48.0)
pCO2 arterial: 40.2 mmHg (ref 32.0–48.0)
pCO2 arterial: 33.8 mmHg (ref 32.0–48.0)
pCO2 arterial: 43.4 mmHg (ref 32.0–48.0)
pCO2 arterial: 43.6 mmHg (ref 32.0–48.0)
pCO2 arterial: 40.1 mmHg (ref 32.0–48.0)
pCO2 arterial: 38 mmHg (ref 32.0–48.0)
pH, Arterial: 7.296 — ABNORMAL LOW (ref 7.350–7.450)
pH, Arterial: 7.324 — ABNORMAL LOW (ref 7.350–7.450)
pH, Arterial: 7.368 (ref 7.350–7.450)
pH, Arterial: 7.397 (ref 7.350–7.450)
pH, Arterial: 7.312 — ABNORMAL LOW (ref 7.350–7.450)
pH, Arterial: 7.295 — ABNORMAL LOW (ref 7.350–7.450)
pH, Arterial: 7.354 (ref 7.350–7.450)
pH, Arterial: 7.348 — ABNORMAL LOW (ref 7.350–7.450)
pO2, Arterial: 300 mmHg — ABNORMAL HIGH (ref 83.0–108.0)
pO2, Arterial: 48 mmHg — ABNORMAL LOW (ref 83.0–108.0)
pO2, Arterial: 272 mmHg — ABNORMAL HIGH (ref 83.0–108.0)
pO2, Arterial: 274 mmHg — ABNORMAL HIGH (ref 83.0–108.0)
pO2, Arterial: 174 mmHg — ABNORMAL HIGH (ref 83.0–108.0)
pO2, Arterial: 95 mmHg (ref 83.0–108.0)
pO2, Arterial: 87 mmHg (ref 83.0–108.0)
pO2, Arterial: 86 mmHg (ref 83.0–108.0)

## 2020-04-15 LAB — GLUCOSE, CAPILLARY
Glucose-Capillary: 133 mg/dL — ABNORMAL HIGH (ref 70–99)
Glucose-Capillary: 153 mg/dL — ABNORMAL HIGH (ref 70–99)
Glucose-Capillary: 142 mg/dL — ABNORMAL HIGH (ref 70–99)
Glucose-Capillary: 147 mg/dL — ABNORMAL HIGH (ref 70–99)
Glucose-Capillary: 163 mg/dL — ABNORMAL HIGH (ref 70–99)
Glucose-Capillary: 151 mg/dL — ABNORMAL HIGH (ref 70–99)
Glucose-Capillary: 151 mg/dL — ABNORMAL HIGH (ref 70–99)
Glucose-Capillary: 153 mg/dL — ABNORMAL HIGH (ref 70–99)

## 2020-04-15 LAB — POCT I-STAT, CHEM 8
BUN: 14 mg/dL (ref 8–23)
BUN: 14 mg/dL (ref 8–23)
BUN: 15 mg/dL (ref 8–23)
BUN: 15 mg/dL (ref 8–23)
BUN: 16 mg/dL (ref 8–23)
BUN: 15 mg/dL (ref 8–23)
Calcium, Ion: 1.31 mmol/L (ref 1.15–1.40)
Calcium, Ion: 1.24 mmol/L (ref 1.15–1.40)
Calcium, Ion: 1.1 mmol/L — ABNORMAL LOW (ref 1.15–1.40)
Calcium, Ion: 1.08 mmol/L — ABNORMAL LOW (ref 1.15–1.40)
Calcium, Ion: 1.07 mmol/L — ABNORMAL LOW (ref 1.15–1.40)
Calcium, Ion: 1.05 mmol/L — ABNORMAL LOW (ref 1.15–1.40)
Chloride: 103 mmol/L (ref 98–111)
Chloride: 103 mmol/L (ref 98–111)
Chloride: 100 mmol/L (ref 98–111)
Chloride: 102 mmol/L (ref 98–111)
Chloride: 99 mmol/L (ref 98–111)
Chloride: 101 mmol/L (ref 98–111)
Creatinine, Ser: 1 mg/dL (ref 0.61–1.24)
Creatinine, Ser: 1.1 mg/dL (ref 0.61–1.24)
Creatinine, Ser: 1 mg/dL (ref 0.61–1.24)
Creatinine, Ser: 1 mg/dL (ref 0.61–1.24)
Creatinine, Ser: 0.9 mg/dL (ref 0.61–1.24)
Creatinine, Ser: 0.9 mg/dL (ref 0.61–1.24)
Glucose, Bld: 114 mg/dL — ABNORMAL HIGH (ref 70–99)
Glucose, Bld: 133 mg/dL — ABNORMAL HIGH (ref 70–99)
Glucose, Bld: 156 mg/dL — ABNORMAL HIGH (ref 70–99)
Glucose, Bld: 178 mg/dL — ABNORMAL HIGH (ref 70–99)
Glucose, Bld: 178 mg/dL — ABNORMAL HIGH (ref 70–99)
Glucose, Bld: 145 mg/dL — ABNORMAL HIGH (ref 70–99)
HCT: 45 % (ref 39.0–52.0)
HCT: 44 % (ref 39.0–52.0)
HCT: 35 % — ABNORMAL LOW (ref 39.0–52.0)
HCT: 39 % (ref 39.0–52.0)
HCT: 35 % — ABNORMAL LOW (ref 39.0–52.0)
HCT: 36 % — ABNORMAL LOW (ref 39.0–52.0)
Hemoglobin: 15.3 g/dL (ref 13.0–17.0)
Hemoglobin: 15 g/dL (ref 13.0–17.0)
Hemoglobin: 11.9 g/dL — ABNORMAL LOW (ref 13.0–17.0)
Hemoglobin: 13.3 g/dL (ref 13.0–17.0)
Hemoglobin: 11.9 g/dL — ABNORMAL LOW (ref 13.0–17.0)
Hemoglobin: 12.2 g/dL — ABNORMAL LOW (ref 13.0–17.0)
Potassium: 4.2 mmol/L (ref 3.5–5.1)
Potassium: 5.1 mmol/L (ref 3.5–5.1)
Potassium: 6.8 mmol/L (ref 3.5–5.1)
Potassium: 5.8 mmol/L — ABNORMAL HIGH (ref 3.5–5.1)
Potassium: 5.7 mmol/L — ABNORMAL HIGH (ref 3.5–5.1)
Potassium: 4.9 mmol/L (ref 3.5–5.1)
Sodium: 139 mmol/L (ref 135–145)
Sodium: 138 mmol/L (ref 135–145)
Sodium: 132 mmol/L — ABNORMAL LOW (ref 135–145)
Sodium: 129 mmol/L — ABNORMAL LOW (ref 135–145)
Sodium: 130 mmol/L — ABNORMAL LOW (ref 135–145)
Sodium: 135 mmol/L (ref 135–145)
TCO2: 26 mmol/L (ref 22–32)
TCO2: 27 mmol/L (ref 22–32)
TCO2: 26 mmol/L (ref 22–32)
TCO2: 22 mmol/L (ref 22–32)
TCO2: 24 mmol/L (ref 22–32)
TCO2: 21 mmol/L — ABNORMAL LOW (ref 22–32)

## 2020-04-15 LAB — CBC
HCT: 44.5 % (ref 39.0–52.0)
HCT: 43.3 % (ref 39.0–52.0)
Hemoglobin: 15.3 g/dL (ref 13.0–17.0)
Hemoglobin: 15.1 g/dL (ref 13.0–17.0)
MCH: 32.3 pg (ref 26.0–34.0)
MCH: 33 pg (ref 26.0–34.0)
MCHC: 34.4 g/dL (ref 30.0–36.0)
MCHC: 34.9 g/dL (ref 30.0–36.0)
MCV: 94.1 fL (ref 80.0–100.0)
MCV: 94.7 fL (ref 80.0–100.0)
Platelets: 127 10*3/uL — ABNORMAL LOW (ref 150–400)
Platelets: 124 10*3/uL — ABNORMAL LOW (ref 150–400)
RBC: 4.73 MIL/uL (ref 4.22–5.81)
RBC: 4.57 MIL/uL (ref 4.22–5.81)
RDW: 11.8 % (ref 11.5–15.5)
RDW: 11.9 % (ref 11.5–15.5)
WBC: 22.1 10*3/uL — ABNORMAL HIGH (ref 4.0–10.5)
WBC: 16.2 10*3/uL — ABNORMAL HIGH (ref 4.0–10.5)
nRBC: 0 % (ref 0.0–0.2)
nRBC: 0 % (ref 0.0–0.2)

## 2020-04-15 LAB — HEMOGLOBIN AND HEMATOCRIT, BLOOD
HCT: 41.2 % (ref 39.0–52.0)
Hemoglobin: 14.4 g/dL (ref 13.0–17.0)

## 2020-04-15 LAB — ECHO INTRAOPERATIVE TEE
Height: 73 in
Weight: 4162.28 oz

## 2020-04-15 LAB — PROTIME-INR
INR: 1.4 — ABNORMAL HIGH (ref 0.8–1.2)
Prothrombin Time: 16.3 seconds — ABNORMAL HIGH (ref 11.4–15.2)

## 2020-04-15 LAB — SURGICAL PCR SCREEN
MRSA, PCR: NEGATIVE
Staphylococcus aureus: NEGATIVE

## 2020-04-15 LAB — BASIC METABOLIC PANEL
Anion gap: 8 (ref 5–15)
BUN: 17 mg/dL (ref 8–23)
CO2: 22 mmol/L (ref 22–32)
Calcium: 7.9 mg/dL — ABNORMAL LOW (ref 8.9–10.3)
Chloride: 108 mmol/L (ref 98–111)
Creatinine, Ser: 1.24 mg/dL (ref 0.61–1.24)
GFR, Estimated: >60 mL/min (ref >60)
Glucose, Bld: 152 mg/dL — ABNORMAL HIGH (ref 70–99)
Potassium: 4.2 mmol/L (ref 3.5–5.1)
Sodium: 138 mmol/L (ref 135–145)

## 2020-04-15 LAB — APTT: aPTT: 31 seconds (ref 24–36)

## 2020-04-15 LAB — PLATELET COUNT: Platelets: 190 10*3/uL (ref 150–400)

## 2020-04-15 LAB — ABO/RH: ABO/RH(D): A POS

## 2020-04-15 LAB — MAGNESIUM: Magnesium: 2.8 mg/dL — ABNORMAL HIGH (ref 1.7–2.4)

## 2020-04-15 SURGERY — CORONARY ARTERY BYPASS GRAFTING (CABG)
Anesthesia: General | Site: Chest

## 2020-04-15 MED ORDER — SODIUM CHLORIDE (PF) 0.9 % IJ SOLN: OROMUCOSAL | Status: DC | PRN

## 2020-04-15 MED ORDER — METOPROLOL TARTRATE 5 MG/5ML IV SOLN
2.5000 mg | INTRAVENOUS | Status: DC | PRN
  Administered 2020-04-17: 5 mg via INTRAVENOUS
  Administered 2020-04-19: 2.5 mg via INTRAVENOUS
  Filled 2020-04-15 (×2): qty 5

## 2020-04-15 MED ORDER — HYDROMORPHONE HCL 1 MG/ML IJ SOLN
INTRAMUSCULAR | Status: AC
  Filled 2020-04-15: qty 0.5

## 2020-04-15 MED ORDER — VASOPRESSIN 20 UNIT/ML IV SOLN
INTRAVENOUS | Status: AC
  Filled 2020-04-15: qty 1

## 2020-04-15 MED ORDER — ACETAMINOPHEN 650 MG RE SUPP
650.0000 mg | Freq: Once | RECTAL | Status: AC
  Administered 2020-04-15: 650 mg via RECTAL

## 2020-04-15 MED ORDER — SODIUM CHLORIDE 0.9 % IV SOLN: 250.0000 mL | INTRAVENOUS | Status: DC

## 2020-04-15 MED ORDER — LACTATED RINGERS IV SOLN: INTRAVENOUS | Status: DC

## 2020-04-15 MED ORDER — ORAL CARE MOUTH RINSE
15.0000 mL | OROMUCOSAL | Status: DC
  Administered 2020-04-15: 15 mL via OROMUCOSAL

## 2020-04-15 MED ORDER — ROCURONIUM BROMIDE 10 MG/ML (PF) SYRINGE
PREFILLED_SYRINGE | INTRAVENOUS | Status: DC | PRN
  Administered 2020-04-15: 10 mg via INTRAVENOUS
  Administered 2020-04-15 (×2): 100 mg via INTRAVENOUS
  Administered 2020-04-15: 50 mg via INTRAVENOUS

## 2020-04-15 MED ORDER — MUPIROCIN 2 % EX OINT
1.0000 "application " | TOPICAL_OINTMENT | Freq: Two times a day (BID) | CUTANEOUS | Status: AC
  Administered 2020-04-15 – 2020-04-19 (×10): 1 via NASAL
  Filled 2020-04-15: qty 22

## 2020-04-15 MED ORDER — PROTAMINE SULFATE 10 MG/ML IV SOLN
INTRAVENOUS | Status: DC | PRN
  Administered 2020-04-15: 290 mg via INTRAVENOUS
  Administered 2020-04-15: 30 mg via INTRAVENOUS

## 2020-04-15 MED ORDER — METOPROLOL TARTRATE 12.5 MG HALF TABLET: 12.5000 mg | ORAL_TABLET | Freq: Once | ORAL | Status: DC

## 2020-04-15 MED ORDER — METOCLOPRAMIDE HCL 5 MG/ML IJ SOLN
10.0000 mg | Freq: Four times a day (QID) | INTRAMUSCULAR | Status: DC
  Administered 2020-04-15 – 2020-04-19 (×15): 10 mg via INTRAVENOUS
  Filled 2020-04-15 (×15): qty 2

## 2020-04-15 MED ORDER — DEXMEDETOMIDINE HCL IN NACL 200 MCG/50ML IV SOLN
0.4000 ug/kg/h | INTRAVENOUS | Status: DC
  Filled 2020-04-15 (×2): qty 50

## 2020-04-15 MED ORDER — CHLORHEXIDINE GLUCONATE 0.12 % MT SOLN: 15.0000 mL | Freq: Once | OROMUCOSAL | Status: DC

## 2020-04-15 MED ORDER — ACETAMINOPHEN 160 MG/5ML PO SOLN
1000.0000 mg | Freq: Four times a day (QID) | ORAL | Status: AC
  Filled 2020-04-15: qty 40.6

## 2020-04-15 MED ORDER — MIDAZOLAM HCL 5 MG/5ML IJ SOLN
INTRAMUSCULAR | Status: DC | PRN
  Administered 2020-04-15: 2 mg via INTRAVENOUS

## 2020-04-15 MED ORDER — PROPOFOL 10 MG/ML IV BOLUS
INTRAVENOUS | Status: DC | PRN
  Administered 2020-04-15: 40 mg via INTRAVENOUS
  Administered 2020-04-15: 110 mg via INTRAVENOUS

## 2020-04-15 MED ORDER — FENTANYL CITRATE (PF) 250 MCG/5ML IJ SOLN
INTRAMUSCULAR | Status: DC | PRN
Start: 2020-04-15 — End: 2020-04-15
  Administered 2020-04-15: 150 ug via INTRAVENOUS
  Administered 2020-04-15 (×2): 50 ug via INTRAVENOUS
  Administered 2020-04-15: 100 ug via INTRAVENOUS
  Administered 2020-04-15: 50 ug via INTRAVENOUS
  Administered 2020-04-15: 100 ug via INTRAVENOUS

## 2020-04-15 MED ORDER — ORAL CARE MOUTH RINSE: 15.0000 mL | Freq: Once | OROMUCOSAL | Status: AC

## 2020-04-15 MED ORDER — EZETIMIBE 10 MG PO TABS
10.0000 mg | ORAL_TABLET | Freq: Every day | ORAL | Status: DC
  Administered 2020-04-16 – 2020-04-23 (×8): 10 mg via ORAL
  Filled 2020-04-15 (×8): qty 1

## 2020-04-15 MED ORDER — ALBUMIN HUMAN 5 % IV SOLN: INTRAVENOUS | Status: DC | PRN

## 2020-04-15 MED ORDER — PLASMA-LYTE 148 IV SOLN
INTRAVENOUS | Status: DC | PRN
  Administered 2020-04-15: 500 mL via INTRAVASCULAR

## 2020-04-15 MED ORDER — TRAMADOL HCL 50 MG PO TABS
50.0000 mg | ORAL_TABLET | ORAL | Status: DC | PRN
  Administered 2020-04-16 – 2020-04-17 (×4): 100 mg via ORAL
  Administered 2020-04-19: 50 mg via ORAL
  Filled 2020-04-15: qty 2
  Filled 2020-04-15: qty 1
  Filled 2020-04-15 (×4): qty 2

## 2020-04-15 MED ORDER — ASPIRIN 81 MG PO CHEW: 324.0000 mg | CHEWABLE_TABLET | Freq: Every day | ORAL | Status: DC

## 2020-04-15 MED ORDER — ONDANSETRON HCL 4 MG/2ML IJ SOLN: 4.0000 mg | Freq: Four times a day (QID) | INTRAMUSCULAR | Status: DC | PRN

## 2020-04-15 MED ORDER — POTASSIUM CHLORIDE 10 MEQ/50ML IV SOLN: 10.0000 meq | INTRAVENOUS | Status: AC

## 2020-04-15 MED ORDER — TRANEXAMIC ACID-NACL 1000-0.7 MG/100ML-% IV SOLN: 1000.0000 mg | INTRAVENOUS | Status: DC

## 2020-04-15 MED ORDER — ASPIRIN EC 325 MG PO TBEC
325.0000 mg | DELAYED_RELEASE_TABLET | Freq: Every day | ORAL | Status: DC
  Administered 2020-04-16 – 2020-04-23 (×8): 325 mg via ORAL
  Filled 2020-04-15 (×8): qty 1

## 2020-04-15 MED ORDER — NITROGLYCERIN IN D5W 200-5 MCG/ML-% IV SOLN
0.0000 ug/min | INTRAVENOUS | Status: DC
  Administered 2020-04-16: 5 ug/min via INTRAVENOUS
  Filled 2020-04-15: qty 250

## 2020-04-15 MED ORDER — BISACODYL 5 MG PO TBEC
10.0000 mg | DELAYED_RELEASE_TABLET | Freq: Every day | ORAL | Status: DC
  Administered 2020-04-16 – 2020-04-22 (×6): 10 mg via ORAL
  Filled 2020-04-15 (×7): qty 2

## 2020-04-15 MED ORDER — PHENYLEPHRINE HCL-NACL 20-0.9 MG/250ML-% IV SOLN: 0.0000 ug/min | INTRAVENOUS | Status: DC

## 2020-04-15 MED ORDER — PANTOPRAZOLE SODIUM 40 MG PO TBEC: 40.0000 mg | DELAYED_RELEASE_TABLET | Freq: Every day | ORAL | Status: DC

## 2020-04-15 MED ORDER — HYDROMORPHONE HCL 1 MG/ML IJ SOLN
INTRAMUSCULAR | Status: DC | PRN
  Administered 2020-04-15 (×2): .5 mg via INTRAVENOUS

## 2020-04-15 MED ORDER — INSULIN REGULAR(HUMAN) IN NACL 100-0.9 UT/100ML-% IV SOLN: INTRAVENOUS | Status: DC

## 2020-04-15 MED ORDER — ROSUVASTATIN CALCIUM 20 MG PO TABS
20.0000 mg | ORAL_TABLET | Freq: Every day | ORAL | Status: DC
  Administered 2020-04-16 – 2020-04-23 (×8): 20 mg via ORAL
  Filled 2020-04-15 (×8): qty 1

## 2020-04-15 MED ORDER — ACETAMINOPHEN 160 MG/5ML PO SOLN: 650.0000 mg | Freq: Once | ORAL | Status: AC

## 2020-04-15 MED ORDER — HEMOSTATIC AGENTS (NO CHARGE) OPTIME
TOPICAL | Status: DC | PRN
  Administered 2020-04-15 (×3): 1 via TOPICAL

## 2020-04-15 MED ORDER — LACTATED RINGERS IV SOLN: INTRAVENOUS | Status: DC | PRN

## 2020-04-15 MED ORDER — PHENYLEPHRINE HCL (PRESSORS) 10 MG/ML IV SOLN
INTRAVENOUS | Status: AC
  Filled 2020-04-15: qty 3

## 2020-04-15 MED ORDER — LIDOCAINE 2% (20 MG/ML) 5 ML SYRINGE
INTRAMUSCULAR | Status: AC
  Filled 2020-04-15: qty 5

## 2020-04-15 MED ORDER — SODIUM CHLORIDE 0.45 % IV SOLN: INTRAVENOUS | Status: DC | PRN

## 2020-04-15 MED ORDER — CHLORHEXIDINE GLUCONATE 4 % EX LIQD: 30.0000 mL | CUTANEOUS | Status: DC

## 2020-04-15 MED ORDER — HEPARIN SODIUM (PORCINE) 1000 UNIT/ML IJ SOLN
INTRAMUSCULAR | Status: DC | PRN
  Administered 2020-04-15: 2000 [IU] via INTRAVENOUS
  Administered 2020-04-15: 32000 [IU] via INTRAVENOUS
  Administered 2020-04-15: 2000 [IU] via INTRAVENOUS

## 2020-04-15 MED ORDER — CHLORHEXIDINE GLUCONATE 0.12 % MT SOLN
15.0000 mL | OROMUCOSAL | Status: AC
  Administered 2020-04-15: 15 mL via OROMUCOSAL

## 2020-04-15 MED ORDER — MAGNESIUM SULFATE 4 GM/100ML IV SOLN
4.0000 g | Freq: Once | INTRAVENOUS | Status: AC
  Administered 2020-04-15: 4 g via INTRAVENOUS

## 2020-04-15 MED ORDER — BISACODYL 10 MG RE SUPP: 10.0000 mg | Freq: Every day | RECTAL | Status: DC

## 2020-04-15 MED ORDER — PROPOFOL 10 MG/ML IV BOLUS
INTRAVENOUS | Status: AC
  Filled 2020-04-15: qty 20

## 2020-04-15 MED ORDER — ACETAMINOPHEN 500 MG PO TABS
1000.0000 mg | ORAL_TABLET | Freq: Four times a day (QID) | ORAL | Status: AC
  Administered 2020-04-15 – 2020-04-20 (×19): 1000 mg via ORAL
  Filled 2020-04-15 (×19): qty 2

## 2020-04-15 MED ORDER — FAMOTIDINE IN NACL 20-0.9 MG/50ML-% IV SOLN
20.0000 mg | Freq: Two times a day (BID) | INTRAVENOUS | Status: AC
  Administered 2020-04-15 (×2): 20 mg via INTRAVENOUS
  Filled 2020-04-15: qty 50

## 2020-04-15 MED ORDER — CALCIUM CHLORIDE 10 % IV SOLN
INTRAVENOUS | Status: DC | PRN
  Administered 2020-04-15: 150 mg via INTRAVENOUS
  Administered 2020-04-15: 250 mg via INTRAVENOUS

## 2020-04-15 MED ORDER — CHLORHEXIDINE GLUCONATE 0.12 % MT SOLN: 15.0000 mL | Freq: Once | OROMUCOSAL | Status: AC

## 2020-04-15 MED ORDER — DEXTROSE 50 % IV SOLN: 0.0000 mL | INTRAVENOUS | Status: DC | PRN

## 2020-04-15 MED ORDER — DEXMEDETOMIDINE HCL IN NACL 400 MCG/100ML IV SOLN: 0.0000 ug/kg/h | INTRAVENOUS | Status: DC

## 2020-04-15 MED ORDER — CHLORHEXIDINE GLUCONATE 0.12 % MT SOLN
OROMUCOSAL | Status: AC
  Administered 2020-04-15: 15 mL via OROMUCOSAL
  Filled 2020-04-15: qty 15

## 2020-04-15 MED ORDER — PHENYLEPHRINE 40 MCG/ML (10ML) SYRINGE FOR IV PUSH (FOR BLOOD PRESSURE SUPPORT)
PREFILLED_SYRINGE | INTRAVENOUS | Status: DC | PRN
  Administered 2020-04-15: 80 ug via INTRAVENOUS
  Administered 2020-04-15: 40 ug via INTRAVENOUS
  Administered 2020-04-15: 120 ug via INTRAVENOUS
  Administered 2020-04-15: 80 ug via INTRAVENOUS

## 2020-04-15 MED ORDER — SODIUM CHLORIDE 0.9 % IV SOLN: INTRAVENOUS | Status: DC

## 2020-04-15 MED ORDER — PANTOPRAZOLE SODIUM 40 MG PO TBEC
40.0000 mg | DELAYED_RELEASE_TABLET | Freq: Every day | ORAL | Status: DC
  Administered 2020-04-17 – 2020-04-23 (×7): 40 mg via ORAL
  Filled 2020-04-15 (×7): qty 1

## 2020-04-15 MED ORDER — DOBUTAMINE IN D5W 4-5 MG/ML-% IV SOLN
2.0000 ug/kg/min | INTRAVENOUS | Status: DC
  Administered 2020-04-15: 4 ug/kg/min via INTRAVENOUS
  Filled 2020-04-15: qty 250

## 2020-04-15 MED ORDER — SODIUM CHLORIDE 0.9% FLUSH
3.0000 mL | INTRAVENOUS | Status: DC | PRN
  Administered 2020-04-21: 3 mL via INTRAVENOUS

## 2020-04-15 MED ORDER — LACTATED RINGERS IV SOLN: 500.0000 mL | Freq: Once | INTRAVENOUS | Status: DC | PRN

## 2020-04-15 MED ORDER — DEXAMETHASONE SODIUM PHOSPHATE 10 MG/ML IJ SOLN
INTRAMUSCULAR | Status: AC
  Filled 2020-04-15: qty 1

## 2020-04-15 MED ORDER — TRANEXAMIC ACID 1000 MG/10ML IV SOLN
1.5000 mg/kg/h | INTRAVENOUS | Status: DC
  Filled 2020-04-15: qty 25

## 2020-04-15 MED ORDER — ROCURONIUM BROMIDE 10 MG/ML (PF) SYRINGE
PREFILLED_SYRINGE | INTRAVENOUS | Status: AC
  Filled 2020-04-15: qty 10

## 2020-04-15 MED ORDER — CHLORHEXIDINE GLUCONATE 0.12% ORAL RINSE (MEDLINE KIT): 15.0000 mL | Freq: Two times a day (BID) | OROMUCOSAL | Status: DC

## 2020-04-15 MED ORDER — ONDANSETRON HCL 4 MG/2ML IJ SOLN
INTRAMUSCULAR | Status: AC
  Filled 2020-04-15: qty 2

## 2020-04-15 MED ORDER — OXYCODONE HCL 5 MG PO TABS
5.0000 mg | ORAL_TABLET | ORAL | Status: DC | PRN
  Administered 2020-04-15 – 2020-04-16 (×2): 5 mg via ORAL
  Administered 2020-04-16: 10 mg via ORAL
  Administered 2020-04-17: 5 mg via ORAL
  Administered 2020-04-17 – 2020-04-18 (×2): 10 mg via ORAL
  Administered 2020-04-18 – 2020-04-19 (×3): 5 mg via ORAL
  Administered 2020-04-21: 10 mg via ORAL
  Filled 2020-04-15: qty 1
  Filled 2020-04-15 (×2): qty 2
  Filled 2020-04-15: qty 1
  Filled 2020-04-15: qty 2
  Filled 2020-04-15: qty 1
  Filled 2020-04-15: qty 2
  Filled 2020-04-15 (×3): qty 1

## 2020-04-15 MED ORDER — ORAL CARE MOUTH RINSE
15.0000 mL | Freq: Two times a day (BID) | OROMUCOSAL | Status: DC
  Administered 2020-04-15 – 2020-04-20 (×10): 15 mL via OROMUCOSAL

## 2020-04-15 MED ORDER — NOREPINEPHRINE 4 MG/250ML-% IV SOLN
0.0000 ug/min | INTRAVENOUS | Status: DC
  Administered 2020-04-15: 2 ug/min via INTRAVENOUS

## 2020-04-15 MED ORDER — CHLORHEXIDINE GLUCONATE CLOTH 2 % EX PADS
6.0000 | MEDICATED_PAD | Freq: Every day | CUTANEOUS | Status: DC
  Administered 2020-04-15 – 2020-04-23 (×7): 6 via TOPICAL

## 2020-04-15 MED ORDER — 0.9 % SODIUM CHLORIDE (POUR BTL) OPTIME
TOPICAL | Status: DC | PRN
  Administered 2020-04-15: 5000 mL

## 2020-04-15 MED ORDER — MIDAZOLAM HCL 2 MG/2ML IJ SOLN
INTRAMUSCULAR | Status: AC
  Filled 2020-04-15: qty 2

## 2020-04-15 MED ORDER — ROCURONIUM BROMIDE 10 MG/ML (PF) SYRINGE
PREFILLED_SYRINGE | INTRAVENOUS | Status: AC
  Filled 2020-04-15: qty 20

## 2020-04-15 MED ORDER — FENTANYL CITRATE (PF) 250 MCG/5ML IJ SOLN
INTRAMUSCULAR | Status: AC
  Filled 2020-04-15: qty 5

## 2020-04-15 MED ORDER — DOCUSATE SODIUM 100 MG PO CAPS
200.0000 mg | ORAL_CAPSULE | Freq: Every day | ORAL | Status: DC
  Administered 2020-04-16 – 2020-04-22 (×7): 200 mg via ORAL
  Filled 2020-04-15 (×8): qty 2

## 2020-04-15 MED ORDER — VANCOMYCIN HCL IN DEXTROSE 1-5 GM/200ML-% IV SOLN
1000.0000 mg | Freq: Once | INTRAVENOUS | Status: AC
  Administered 2020-04-15: 1000 mg via INTRAVENOUS
  Filled 2020-04-15: qty 200

## 2020-04-15 MED ORDER — ALBUMIN HUMAN 5 % IV SOLN
250.0000 mL | INTRAVENOUS | Status: AC | PRN
  Administered 2020-04-15 (×2): 12.5 g via INTRAVENOUS

## 2020-04-15 MED ORDER — SODIUM CHLORIDE 0.9% FLUSH
3.0000 mL | Freq: Two times a day (BID) | INTRAVENOUS | Status: DC
  Administered 2020-04-16 – 2020-04-22 (×12): 3 mL via INTRAVENOUS

## 2020-04-15 MED ORDER — VASOPRESSIN 20 UNIT/ML IV SOLN
INTRAVENOUS | Status: DC | PRN
  Administered 2020-04-15 (×4): 1 [IU] via INTRAVENOUS

## 2020-04-15 MED ORDER — LEVOFLOXACIN IN D5W 750 MG/150ML IV SOLN
750.0000 mg | INTRAVENOUS | Status: AC
  Administered 2020-04-16: 750 mg via INTRAVENOUS
  Filled 2020-04-15: qty 150

## 2020-04-15 MED ORDER — METOPROLOL TARTRATE 25 MG/10 ML ORAL SUSPENSION: 12.5000 mg | Freq: Two times a day (BID) | ORAL | Status: DC

## 2020-04-15 MED ORDER — MORPHINE SULFATE (PF) 2 MG/ML IV SOLN
1.0000 mg | INTRAVENOUS | Status: DC | PRN
  Administered 2020-04-15: 2 mg via INTRAVENOUS
  Administered 2020-04-15: 4 mg via INTRAVENOUS
  Administered 2020-04-15 (×2): 2 mg via INTRAVENOUS
  Administered 2020-04-16: 4 mg via INTRAVENOUS
  Administered 2020-04-16: 2 mg via INTRAVENOUS
  Administered 2020-04-16: 4 mg via INTRAVENOUS
  Administered 2020-04-17: 2 mg via INTRAVENOUS
  Filled 2020-04-15 (×3): qty 1
  Filled 2020-04-15: qty 2
  Filled 2020-04-15 (×2): qty 1
  Filled 2020-04-15: qty 2
  Filled 2020-04-15 (×2): qty 1

## 2020-04-15 MED ORDER — MIDAZOLAM HCL 2 MG/2ML IJ SOLN: 2.0000 mg | INTRAMUSCULAR | Status: DC | PRN

## 2020-04-15 MED ORDER — METOPROLOL TARTRATE 12.5 MG HALF TABLET
12.5000 mg | ORAL_TABLET | Freq: Two times a day (BID) | ORAL | Status: DC
  Administered 2020-04-16: 12.5 mg via ORAL
  Filled 2020-04-15: qty 1

## 2020-04-15 MED FILL — Potassium Chloride Inj 2 mEq/ML: INTRAVENOUS | Qty: 40 | Status: AC

## 2020-04-15 MED FILL — Heparin Sodium (Porcine) Inj 1000 Unit/ML: INTRAMUSCULAR | Qty: 30 | Status: AC

## 2020-04-15 MED FILL — Magnesium Sulfate Inj 50%: INTRAMUSCULAR | Qty: 10 | Status: AC

## 2020-04-15 SURGICAL SUPPLY — 97 items
ADAPTER CARDIO PERF ANTE/RETRO (ADAPTER) ×4 IMPLANT
BAG DECANTER FOR FLEXI CONT (MISCELLANEOUS) ×4 IMPLANT
BLADE CLIPPER SURG (BLADE) ×4 IMPLANT
BLADE NEEDLE 3 SS STRL (BLADE) ×3 IMPLANT
BLADE NEEDLE 3MM SS STRL (BLADE) ×1
BLADE STERNUM SYSTEM 6 (BLADE) ×4 IMPLANT
BLADE SURG 11 STRL SS (BLADE) ×4 IMPLANT
BLADE SURG 12 STRL SS (BLADE) ×4 IMPLANT
BNDG ELASTIC 4X5.8 VLCR STR LF (GAUZE/BANDAGES/DRESSINGS) ×4 IMPLANT
BNDG ELASTIC 6X5.8 VLCR STR LF (GAUZE/BANDAGES/DRESSINGS) ×4 IMPLANT
BNDG GAUZE ELAST 4 BULKY (GAUZE/BANDAGES/DRESSINGS) ×4 IMPLANT
CANISTER SUCT 3000ML PPV (MISCELLANEOUS) ×4 IMPLANT
CANNULA ARTERIAL NVNT 3/8 22FR (MISCELLANEOUS) ×4 IMPLANT
CANNULA GUNDRY RCSP 15FR (MISCELLANEOUS) ×8 IMPLANT
CATH CPB KIT VANTRIGT (MISCELLANEOUS) ×4 IMPLANT
CATH ROBINSON RED A/P 18FR (CATHETERS) ×12 IMPLANT
CATH THORACIC 28FR RT ANG (CATHETERS) ×8 IMPLANT
CLIP FOGARTY SPRING 6M (CLIP) ×4 IMPLANT
CLIP RETRACTION 3.0MM CORONARY (MISCELLANEOUS) ×4 IMPLANT
DEFOGGER ANTIFOG KIT (MISCELLANEOUS) ×4 IMPLANT
DRAIN CHANNEL 32F RND 10.7 FF (WOUND CARE) ×4 IMPLANT
DRAPE CARDIOVASCULAR INCISE (DRAPES) ×2
DRAPE SLUSH/WARMER DISC (DRAPES) ×4 IMPLANT
DRAPE SRG 135X102X78XABS (DRAPES) ×2 IMPLANT
DRSG AQUACEL AG ADV 3.5X14 (GAUZE/BANDAGES/DRESSINGS) ×4 IMPLANT
ELECT BLADE 4.0 EZ CLEAN MEGAD (MISCELLANEOUS) ×4
ELECT BLADE 6.5 EXT (BLADE) ×4 IMPLANT
ELECT CAUTERY BLADE 6.4 (BLADE) ×4 IMPLANT
ELECT REM PT RETURN 9FT ADLT (ELECTROSURGICAL) ×8
ELECTRODE BLDE 4.0 EZ CLN MEGD (MISCELLANEOUS) ×2 IMPLANT
ELECTRODE REM PT RTRN 9FT ADLT (ELECTROSURGICAL) ×4 IMPLANT
FELT TEFLON 1X6 (MISCELLANEOUS) ×8 IMPLANT
GAUZE SPONGE 4X4 12PLY STRL (GAUZE/BANDAGES/DRESSINGS) ×8 IMPLANT
GLOVE BIO SURGEON STRL SZ 6 (GLOVE) ×24 IMPLANT
GLOVE BIO SURGEON STRL SZ7.5 (GLOVE) ×16 IMPLANT
GLOVE BIOGEL PI IND STRL 6 (GLOVE) ×8 IMPLANT
GLOVE BIOGEL PI IND STRL 6.5 (GLOVE) ×8 IMPLANT
GLOVE BIOGEL PI IND STRL 9 (GLOVE) ×2 IMPLANT
GLOVE BIOGEL PI INDICATOR 6 (GLOVE) ×8
GLOVE BIOGEL PI INDICATOR 6.5 (GLOVE) ×8
GLOVE BIOGEL PI INDICATOR 9 (GLOVE) ×2
GOWN STRL REUS W/ TWL LRG LVL3 (GOWN DISPOSABLE) ×28 IMPLANT
GOWN STRL REUS W/TWL LRG LVL3 (GOWN DISPOSABLE) ×28
HEMOSTAT POWDER SURGIFOAM 1G (HEMOSTASIS) ×12 IMPLANT
HEMOSTAT SURGICEL 2X14 (HEMOSTASIS) ×4 IMPLANT
KIT BASIN OR (CUSTOM PROCEDURE TRAY) ×4 IMPLANT
KIT SUCTION CATH 14FR (SUCTIONS) ×4 IMPLANT
KIT TURNOVER KIT B (KITS) ×4 IMPLANT
KIT VASOVIEW HEMOPRO 2 VH 4000 (KITS) ×4 IMPLANT
LEAD PACING MYOCARDI (MISCELLANEOUS) ×4 IMPLANT
MARKER GRAFT CORONARY BYPASS (MISCELLANEOUS) ×12 IMPLANT
NDL SUT 4 .5 CRC FRENCH EYE (NEEDLE) ×2 IMPLANT
NEEDLE FRENCH EYE (NEEDLE) ×2
NS IRRIG 1000ML POUR BTL (IV SOLUTION) ×20 IMPLANT
PACK E OPEN HEART (SUTURE) ×4 IMPLANT
PACK OPEN HEART (CUSTOM PROCEDURE TRAY) ×4 IMPLANT
PAD ARMBOARD 7.5X6 YLW CONV (MISCELLANEOUS) ×8 IMPLANT
PAD ELECT DEFIB RADIOL ZOLL (MISCELLANEOUS) ×4 IMPLANT
PENCIL BUTTON HOLSTER BLD 10FT (ELECTRODE) ×4 IMPLANT
POSITIONER HEAD DONUT 9IN (MISCELLANEOUS) ×4 IMPLANT
POWDER SURGICEL 3.0 GRAM (HEMOSTASIS) ×4 IMPLANT
PUNCH AORTIC ROTATE 4.0MM (MISCELLANEOUS) IMPLANT
PUNCH AORTIC ROTATE 4.5MM 8IN (MISCELLANEOUS) ×4 IMPLANT
PUNCH AORTIC ROTATE 5MM 8IN (MISCELLANEOUS) IMPLANT
SET CARDIOPLEGIA MPS 5001102 (MISCELLANEOUS) ×4 IMPLANT
SPONGE LAP 4X18 RFD (DISPOSABLE) ×4 IMPLANT
SUPPORT HEART JANKE-BARRON (MISCELLANEOUS) ×4 IMPLANT
SURGIFLO W/THROMBIN 8M KIT (HEMOSTASIS) ×4 IMPLANT
SUT BONE WAX W31G (SUTURE) ×4 IMPLANT
SUT MNCRL AB 4-0 PS2 18 (SUTURE) ×4 IMPLANT
SUT PROLENE 4 0 RB 1 (SUTURE) ×8
SUT PROLENE 4 0 SH DA (SUTURE) ×4 IMPLANT
SUT PROLENE 4-0 RB1 .5 CRCL 36 (SUTURE) ×4 IMPLANT
SUT PROLENE 4-0 RB1 18X2 ARM (SUTURE) ×4 IMPLANT
SUT PROLENE 5 0 C 1 36 (SUTURE) ×12 IMPLANT
SUT PROLENE 6 0 C 1 30 (SUTURE) ×16 IMPLANT
SUT PROLENE 6 0 CC (SUTURE) ×28 IMPLANT
SUT PROLENE 8 0 BV175 6 (SUTURE) ×8 IMPLANT
SUT PROLENE BLUE 7 0 (SUTURE) ×12 IMPLANT
SUT PROLENE POLY MONO (SUTURE) ×8 IMPLANT
SUT SILK 2 0 SH CR/8 (SUTURE) ×12 IMPLANT
SUT SILK 3 0 SH CR/8 (SUTURE) IMPLANT
SUT STEEL 6MS V (SUTURE) ×8 IMPLANT
SUT STEEL SZ 6 DBL 3X14 BALL (SUTURE) ×4 IMPLANT
SUT VIC AB 1 CTX 36 (SUTURE) ×4
SUT VIC AB 1 CTX36XBRD ANBCTR (SUTURE) ×4 IMPLANT
SUT VIC AB 2-0 CT1 27 (SUTURE) ×2
SUT VIC AB 2-0 CT1 TAPERPNT 27 (SUTURE) ×2 IMPLANT
SYR BULB IRRIG 60ML STRL (SYRINGE) ×8 IMPLANT
SYSTEM SAHARA CHEST DRAIN ATS (WOUND CARE) ×4 IMPLANT
TAPE CLOTH SURG 4X10 WHT LF (GAUZE/BANDAGES/DRESSINGS) ×4 IMPLANT
TOWEL GREEN STERILE (TOWEL DISPOSABLE) ×4 IMPLANT
TOWEL GREEN STERILE FF (TOWEL DISPOSABLE) ×4 IMPLANT
TRAY FOLEY SLVR 16FR TEMP STAT (SET/KITS/TRAYS/PACK) ×4 IMPLANT
TUBING LAP HI FLOW INSUFFLATIO (TUBING) ×4 IMPLANT
UNDERPAD 30X36 HEAVY ABSORB (UNDERPADS AND DIAPERS) ×4 IMPLANT
WATER STERILE IRR 1000ML POUR (IV SOLUTION) ×8 IMPLANT

## 2020-04-15 NOTE — Anesthesia Procedure Notes (Signed)
Arterial Line Insertion Start/End10/14/2021 7:00 AM, 04/15/2020 7:10 AM Performed by: Ezekiel Ina, CRNA, CRNA  Patient location: Pre-op. Preanesthetic checklist: patient identified, IV checked, site marked, risks and benefits discussed, surgical consent, monitors and equipment checked, pre-op evaluation, timeout performed and anesthesia consent Lidocaine 1% used for infiltration Right, radial was placed Catheter size: 20 G Hand hygiene performed  and Seldinger technique used Allen's test indicative of satisfactory collateral circulation Attempts: 1 Procedure performed without using ultrasound guided technique. Following insertion, dressing applied. Post procedure assessment: normal and unchanged  Patient tolerated the procedure well with no immediate complications.

## 2020-04-15 NOTE — Anesthesia Procedure Notes (Signed)
Central Venous Catheter Insertion Performed by: Darral Dash, DO, anesthesiologist Start/End10/14/2021 6:40 AM, 04/15/2020 6:50 AM Patient location: Pre-op. Preanesthetic checklist: patient identified, IV checked, site marked, risks and benefits discussed, surgical consent, monitors and equipment checked, pre-op evaluation, timeout performed and anesthesia consent Position: Trendelenburg Lidocaine 1% used for infiltration and patient sedated Hand hygiene performed  and maximum sterile barriers used  Catheter size: 9 Fr Central line was placed.MAC introducer Procedure performed using ultrasound guided technique. Ultrasound Notes:anatomy identified, needle tip was noted to be adjacent to the nerve/plexus identified, no ultrasound evidence of intravascular and/or intraneural injection and image(s) printed for medical record Attempts: 1 Following insertion, line sutured, dressing applied and Biopatch. Post procedure assessment: blood return through all ports, free fluid flow and no air  Patient tolerated the procedure well with no immediate complications.

## 2020-04-15 NOTE — Progress Notes (Signed)
  Echocardiogram Echocardiogram Transesophageal has been performed.  Delcie Roch 04/15/2020, 8:32 AM

## 2020-04-15 NOTE — Progress Notes (Signed)
Patient ID: Joshuajames Moehring, male   DOB: Nov 23, 1955, 64 y.o.   MRN: [id number]  TCTS Evening Rounds:   Hemodynamically stable  CI = 2.7 on dobut 4  Extubated  Urine output good  CT output low  CBC    Component Value Date/Time   WBC 22.1 (H) 04/15/2020 1521   RBC 4.73 04/15/2020 1521   HGB 15.3 04/15/2020 1521   HGB 17.1 03/11/2020 1637   HCT 44.5 04/15/2020 1521   HCT 48.8 03/11/2020 1637   PLT 127 (L) 04/15/2020 1521   PLT 256 03/11/2020 1637   MCV 94.1 04/15/2020 1521   MCV 92 03/11/2020 1637   MCH 32.3 04/15/2020 1521   MCHC 34.4 04/15/2020 1521   RDW 11.8 04/15/2020 1521   RDW 12.1 03/11/2020 1637   LYMPHSABS 2.5 03/02/2020 1002   EOSABS 0.2 03/02/2020 1002   BASOSABS 0.1 03/02/2020 1002     BMET    Component Value Date/Time   NA 135 04/15/2020 1356   NA 138 03/11/2020 1637   K 4.9 04/15/2020 1356   CL 101 04/15/2020 1356   CO2 20 (L) 04/13/2020 1144   GLUCOSE 145 (H) 04/15/2020 1356   BUN 15 04/15/2020 1356   BUN 16 03/11/2020 1637   CREATININE 0.90 04/15/2020 1356   CALCIUM 9.3 04/13/2020 1144   GFRNONAA >60 04/13/2020 1144   GFRAA 82 03/11/2020 1637     A/P:  Stable postop course. Continue current plans

## 2020-04-15 NOTE — Anesthesia Preprocedure Evaluation (Addendum)
Anesthesia Evaluation  Patient identified by MRN, date of birth, ID band Patient awake    Reviewed: Allergy & Precautions, NPO status , Patient's Chart, lab work & pertinent test results  Airway Mallampati: II  TM Distance: <3 FB Neck ROM: Limited    Dental  (+) Edentulous Upper, Dental Advisory Given   Pulmonary neg pulmonary ROS,    breath sounds clear to auscultation       Cardiovascular hypertension, Pt. on medications + CAD   Rhythm:Regular Rate:Normal     Neuro/Psych negative neurological ROS  negative psych ROS   GI/Hepatic Neg liver ROS, GERD  Medicated,  Endo/Other  negative endocrine ROS  Renal/GU negative Renal ROS     Musculoskeletal negative musculoskeletal ROS (+)   Abdominal Normal abdominal exam  (+)   Peds  Hematology negative hematology ROS (+)   Anesthesia Other Findings   Reproductive/Obstetrics negative OB ROS                            Anesthesia Physical Anesthesia Plan  ASA: IV  Anesthesia Plan: General   Post-op Pain Management:    Induction: Intravenous  PONV Risk Score and Plan: 2 and Ondansetron, Treatment may vary due to age or medical condition and Midazolam  Airway Management Planned: Oral ETT  Additional Equipment: Arterial line, CVP, PA Cath, TEE and Ultrasound Guidance Line Placement  Intra-op Plan:   Post-operative Plan: Post-operative intubation/ventilation  Informed Consent: I have reviewed the patients History and Physical, chart, labs and discussed the procedure including the risks, benefits and alternatives for the proposed anesthesia with the patient or authorized representative who has indicated his/her understanding and acceptance.     Dental advisory given  Plan Discussed with: CRNA  Anesthesia Plan Comments: (Lab Results      Component                Value               Date                      WBC                      9.0                  04/13/2020                HGB                      17.4 (H)            04/13/2020                HCT                      49.9                04/13/2020                MCV                      93.8                04/13/2020                PLT  241                 04/13/2020            Echo: 1. Left ventricular ejection fraction, by estimation, is 55 to 60%. The  left ventricle has normal function. The left ventricle has no regional  wall motion abnormalities. There is severe concentric left ventricular  hypertrophy. Left ventricular diastolic  parameters are consistent with Grade I diastolic dysfunction (impaired  relaxation).  2. Right ventricular systolic function is normal. The right ventricular  size is normal. There is normal pulmonary artery systolic pressure.  3. The mitral valve is normal in structure. No evidence of mitral valve  regurgitation. No evidence of mitral stenosis.  4. The aortic valve is normal in structure. There is mild calcification  of the aortic valve. Aortic valve regurgitation is not visualized. Mild  aortic valve sclerosis is present, with no evidence of aortic valve  stenosis.  5. There is mild dilatation of the ascending aorta, measuring 38 mm.  6. The inferior vena cava is normal in size with greater than 50%  respiratory variability, suggesting right atrial pressure of 3 mmHg. )       Anesthesia Quick Evaluation

## 2020-04-15 NOTE — Anesthesia Procedure Notes (Signed)
Procedure Name: Intubation Performed by: Ezekiel Ina, CRNA Pre-anesthesia Checklist: Patient identified, Emergency Drugs available, Suction available and Patient being monitored Patient Re-evaluated:Patient Re-evaluated prior to induction Oxygen Delivery Method: Circle System Utilized Preoxygenation: Pre-oxygenation with 100% oxygen Induction Type: IV induction Ventilation: Mask ventilation with difficulty and Oral airway inserted - appropriate to patient size Laryngoscope Size: Hyacinth Meeker and 2 Grade View: Grade I Tube type: Oral Tube size: 8.0 mm Number of attempts: 1 Airway Equipment and Method: Stylet and Oral airway Placement Confirmation: ETT inserted through vocal cords under direct vision,  positive ETCO2 and breath sounds checked- equal and bilateral Secured at: 24 cm Tube secured with: Tape Dental Injury: Teeth and Oropharynx as per pre-operative assessment

## 2020-04-15 NOTE — Hospital Course (Addendum)
History of Present Illness:   Dan Nelson is a 65 yo male with history Hypertension and Dyslipidemia.  He developed symptoms consistent with angina.  He was evaluated by [name] who sent patient for a stress test.  This was abnormal and he was subsequently referred for cardiac catheterization.  This was performed by [name] on 03/16/2020.  This showed multivessel CAD and a preserved EF.  It was felt coronary bypass grafting would be indicated and the patient was referred to [facility] for evaluation.  The patient was evaluated by [name] at which time the patient  had denied recurrent significant pain.  It was felt the patient would be a candidate for coronary bypass grafting.  The risks and benefits of the procedure were explained to the patient and he was agreeable to proceed.   Hospital Course:  [name] presented to [facility] on 04/15/2020.  He was taken to the operating room and underwent CABG x 4 utilizing LIMA to LAD, SVG to PDA, SVG to OM, and SVG to Diagonal.  He also underwent Endoscopic harvest of greater saphenous vein graft from his right leg.  He tolerated the procedure without difficulty and was taken to the SICU in stable condition.  He was extubated the evening of surgery.  During his stay in the SICU the patient was weaned off Dobutamine as tolerated.  His EPW were not functional and he they were removed without difficulty.  His chest tubes and arterial lines were removed without difficulty.  He developed an elevated creatinine level with a peak of 2.35.  He was treated with albumin and diuretics were hold.   Follow up creatinine level showed improvement of 1.40.  He developed Atrial Fibrillation and was treated with IV Amiodarone.  His potassium level was slightly low and he was supplemented accordingly.  He converted to NSR and was transferred to the progressive care unit on 04/19/2020.  The patient remained in NSR, but was bradycardic.  He  was transitioned to an oral regimen of Amiodarone.  His pacing wires were removed without difficulty.  He was volume overloaded and started on IV Lasix and TED hose were placed.  The patient developed difficulty voiding.  Urinalysis was obtained and showed no evidence of infection.  There were red blood cells present which wouldn't be unexpected after a foley catheter was in place.  Post void bladder scan showed almost 900 cc of urine present.  He was started on flomax and a foley catheter was placed.  He will be discharged home with this in place and require urology follow up which has been set up with [facility] in November.  He continued to have episodes of Atrial Fibrillation.  He was treated with IV Lopressor and started on low dose Coreg.  He remained on Amiodarone 400 mg BID.  The patient's urinary symptoms resolved with placement of foley catheter.  He maintained NSR.  He is ambulating without difficulty.  His surgical incisions are healing without evidence of infection.  The patient is medically stable for discharge home today.

## 2020-04-15 NOTE — Transfer of Care (Signed)
Immediate Anesthesia Transfer of Care Note  Patient: Dan Nelson  Procedure(s) Performed: Coronary artery bypass grafting times four using left internal mammary artery and endoscopically harvested right saphenous vein graft (N/A Chest) TRANSESOPHAGEAL ECHOCARDIOGRAM (TEE) (N/A )  Patient Location: ICU  Anesthesia Type:General  Level of Consciousness: sedated and Patient remains intubated per anesthesia plan  Airway & Oxygen Therapy: Patient Spontanous Breathing, Patient remains intubated per anesthesia plan and Patient placed on Ventilator (see vital sign flow sheet for setting)  Post-op Assessment: Report given to RN and Post -op Vital signs reviewed and stable  Post vital signs: Reviewed and stable  Last Vitals:  Vitals Value Taken Time  BP    Temp    Pulse 68 04/15/20 1534  Resp 20 04/15/20 1534  SpO2 99 % 04/15/20 1534    Last Pain:  Vitals:   04/15/20 0612  PainSc: 0-No pain         Complications: No complications documented.

## 2020-04-15 NOTE — Brief Op Note (Signed)
04/15/2020  7:26 AM  PATIENT:  Dan Nelson  64 y.o. male  PRE-OPERATIVE DIAGNOSIS:  Coronary artery disease  POST-OPERATIVE DIAGNOSIS:  Coronary artery disease  PROCEDURE:  Procedure(s):  CORONARY ARTERY BYPASS GRAFTING x 4 -LIMA to LAD -SVG to DIAGONAL -SVG to OM -SVG to PDA  ENDOSCOPIC HARVEST GREATER SAPHENOUS VEIN -Right Leg (40 min/ 20 min prep)  TRANSESOPHAGEAL ECHOCARDIOGRAM (TEE) (N/A)  SURGEON:  Surgeon(s) and Role:    Kerin Perna, MD - Primary  PHYSICIAN ASSISTANT: Lowella Dandy PA-C  ANESTHESIA:   general  EBL:  1807 mL   BLOOD ADMINISTERED: CELLSAVER  DRAINS: Left and Right Pleural Chest Tube, Mediastinal chest drain   LOCAL MEDICATIONS USED:  NONE  SPECIMEN:  No Specimen  DISPOSITION OF SPECIMEN:  N/A  COUNTS:  YES  TOURNIQUET:  * No tourniquets in log *  DICTATION: .Dragon Dictation  PLAN OF CARE: Admit to inpatient   PATIENT DISPOSITION:  ICU - intubated and hemodynamically stable.   Delay start of Pharmacological VTE agent (>24hrs) due to surgical blood loss or risk of bleeding: yes

## 2020-04-15 NOTE — Anesthesia Procedure Notes (Signed)
Central Venous Catheter Insertion Performed by: Atilano Median, DO, anesthesiologist Start/End10/14/2021 6:50 AM, 04/15/2020 6:55 AM Patient location: Pre-op. Preanesthetic checklist: patient identified, IV checked, site marked, risks and benefits discussed, surgical consent, monitors and equipment checked, pre-op evaluation, timeout performed and anesthesia consent Hand hygiene performed  and maximum sterile barriers used  Total catheter length 53. PA cath was placed.Swan type:thermodilution Procedure performed without using ultrasound guided technique. Attempts: 1 Patient tolerated the procedure well with no immediate complications.

## 2020-04-15 NOTE — Procedures (Signed)
Extubation Procedure Note  Patient Details:   Name: Dan Nelson DOB: 1955/07/20 MRN: [id number]   Airway Documentation:    Vent end date: 04/15/20 Vent end time: 1819   Evaluation  O2 sats: stable throughout Complications: No apparent complications Patient did tolerate procedure well. Bilateral Breath Sounds: Clear   Pt extubated to 4L [place] per rapid wean protocol. NIF was -30 and VC was . Pt had positive cuff leak. Pt able to voice his name and no stridor noted.  Guss Bunde 04/15/2020, 6:20 PM

## 2020-04-15 NOTE — H&P (Signed)
PCP is Dan Fear, NP Referring Provider is No ref. provider found  No chief complaint on file.   HPI: Patient referred for evaluation for CABG for recent diagnosis of multivessel CAD.  Patient had a episode of exertional chest pain and underwent stress test.  This showed inferior lateral ischemia.  Subsequent cardiac catheterization by Dr. Verdis Prime demonstrates occlusion of a large OM1 with high-grade stenosis of the LAD diagonal, high-grade stenosis of the mid to distal posterior descending and preserved LV function by echo.  The patient has had no significant recurrent pain.  He has a strong family history of heart disease hypertension and stroke.  He is a nondiabetic.  He has had no previous surgical procedures or general anesthesia.  He has a sinus rhythm and no significant valve disease on echo.   Past Medical History:  Diagnosis Date  . Benign essential hypertension   . 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 Coronary artery disease   . Dyspnea   . GERD without esophagitis   . Hyperlipidemia   . Liver enzyme elevation   . Obesity   . Spinal stenosis, lumbar   . Statin intolerance     Past Surgical History:  Procedure Laterality Date  . CARDIAC CATHETERIZATION    . Cosmetic ear surgery bilateral, childhood    . LEFT HEART CATH AND CORONARY ANGIOGRAPHY N/A 03/16/2020   Procedure: LEFT HEART CATH AND CORONARY ANGIOGRAPHY;  Surgeon: Lyn Records, MD;  Location: [facility];  Service: Cardiovascular;  Laterality: N/A;  . 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 Hypercholesterolemia Mother   . CAD Mother   . Heart attack Mother   . CAD Father   . Heart attack Father   . Stroke Father   . Hypercholesterolemia Brother     Social History Social History   Tobacco Use  . Smoking status: Unknown If Ever Smoked  . Smokeless tobacco: Current User    Types: Snuff, Chew   Substance Use Topics  . Alcohol use: Never  . Drug use: Not on file    Current Facility-Administered Medications  Medication Dose Route Frequency Provider Last Rate Last Admin  . dexmedetomidine (PRECEDEX) 400 MCG/100ML (4 mcg/mL) infusion  0.1-0.7 mcg/kg/hr Intravenous To OR Donata Clay, Theron Arista, MD      . EPINEPHrine (ADRENALIN) 4 mg in NS 250 mL (0.016 mg/mL) premix infusion  0-10 mcg/min Intravenous To OR Donata Clay, Theron Arista, MD      . heparin 30,000 units/NS 1000 mL solution for CELLSAVER   Other To OR Donata Clay, Theron Arista, MD      . heparin sodium (porcine) 2,500 Units, papaverine 30 mg in electrolyte-148 (PLASMALYTE-148) 500 mL irrigation   Irrigation To OR Donata Clay, Theron Arista, MD      . insulin regular, human (MYXREDLIN) 100 units/ 100 mL infusion   Intravenous To OR Donata Clay, Theron Arista, MD      . lactated ringers infusion   Intravenous Continuous Shelton Silvas, MD      . levofloxacin (LEVAQUIN) IVPB 500 mg  500 mg Intravenous To OR Donata Clay, Theron Arista, MD      . magnesium sulfate (IV Push/IM) injection 40 mEq  40 mEq Other To OR Donata Clay, Theron Arista, MD      . metoprolol tartrate (LOPRESSOR) tablet 12.5 mg  12.5 mg Oral Once Kerin Perna, MD      .  milrinone (PRIMACOR) 20 MG/100 ML (0.2 mg/mL) infusion  0.3 mcg/kg/min Intravenous To OR Donata Clay, Theron Arista, MD      . nitroGLYCERIN 50 mg in dextrose 5 % 250 mL (0.2 mg/mL) infusion  2-200 mcg/min Intravenous To OR Donata Clay, Theron Arista, MD      . norepinephrine (LEVOPHED) 4mg  in premix infusion  0-40 mcg/min Intravenous To OR , Donata Clay, MD      . phenylephrine (NEOSYNEPHRINE) 20-0.9 MG/250ML-% infusion  30-200 mcg/min Intravenous To OR Theron Arista, Donata Clay, MD      . potassium chloride injection 80 mEq  80 mEq Other To OR Theron Arista, Donata Clay, MD      . tranexamic acid (CYKLOKAPRON) 2,500 mg in sodium chloride 0.9 % 250 mL (10 mg/mL) infusion  1.5 mg/kg/hr Intravenous To OR Theron Arista, Donata Clay, MD      . tranexamic acid (CYKLOKAPRON) bolus via infusion - over  30 minutes 1,770 mg  15 mg/kg Intravenous To OR Theron Arista, Donata Clay, MD      . tranexamic acid (CYKLOKAPRON) pump prime solution 236 mg  2 mg/kg Intracatheter To OR Theron Arista, Donata Clay, MD      . vancomycin Theron Arista) IVPB 1500 mg/300 mL  1,500 mg Intravenous To OR Luna Kitchens, MD       Facility-Administered Medications Ordered in Other Encounters  Medication Dose Route Frequency Provider Last Rate Last Admin  . fentaNYL citrate (PF) (SUBLIMAZE) injection   Intravenous Anesthesia Intra-op Kerin Perna, CRNA   50 mcg at 04/15/20 0645  . midazolam (VERSED) 5 MG/5ML injection   Intravenous Anesthesia Intra-op 04/17/20, CRNA   2 mg at 04/15/20 04/17/20    Allergies  Allergen Reactions  . Penicillins Swelling and Rash       . Augmentin [Amoxicillin-Pot Clavulanate] Swelling  . Crestor [Rosuvastatin] Swelling    Hands swell  . Zocor [Simvastatin] Swelling    Hands swell    Review of Systems  Patient is right-hand dominant No history of thoracic trauma or rib fractures or pneumothorax He is a non-smoker No history of TIA or syncope Upper dental plate, no dental symptoms of his lower teeth Weight has been stable this summer No history of DVT or varicose veins No history of bleeding disorder blood transfusion Patient had Covid December 2020 and a vaccination May 2021 BP (!) 158/81   Pulse (!) 58   Temp 98.1 F (36.7 C)   Resp 20   Ht 6\' 1"  (1.854 m)   Wt 118 kg   SpO2 97%   BMI 34.32 kg/m  Physical Exam     Physical Exam  General: Obese middle-aged male no acute distress HEENT: Normocephalic pupils equal , dentition adequate Neck: Supple without JVD, adenopathy, or bruit Chest: Clear to auscultation, symmetrical breath sounds, no rhonchi, no tenderness             or deformity Cardiovascular: Regular rate and rhythm, no murmur, no gallop, peripheral pulses             palpable in all extremities Abdomen:  Soft, obese, nontender, no palpable mass or  organomegaly Extremities: Warm, well-perfused, no clubbing cyanosis edema or tenderness,              no venous stasis changes of the legs Rectal/GU: Deferred Neuro: Grossly non--focal and symmetrical throughout Skin: Clean and dry without rash or ulceration   Diagnostic Tests: Coronary angiograms reviewed with disease as described above.  Impression: Symptomatic severe three-vessel coronary disease Hypertension Obesity  History of low back pain and spinal injections   Plan: Patient will be prepared for multivessel CABG on October 14 at [facility]  I have discussed the details of the surgery including the expected benefits and risks and he understands and agrees to proceed.   Mikey Bussing, MD [facility] [phone]  Pre Procedure note for inpatients:   Nancy Manuele has been scheduled for Procedure(s): CORONARY ARTERY BYPASS GRAFTING (CABG) (N/A) TRANSESOPHAGEAL ECHOCARDIOGRAM (TEE) (N/A) today. The various methods of treatment have been discussed with the patient. After consideration of the risks, benefits and treatment options the patient has consented to the planned procedure.   The patient has been seen and labs reviewed. There are no changes in the patient's condition to prevent proceeding with the planned procedure today.  Recent labs:  Lab Results  Component Value Date   WBC 9.0 04/13/2020   HGB 17.4 (H) 04/13/2020   HCT 49.9 04/13/2020   PLT 241 04/13/2020   GLUCOSE 95 04/13/2020   CHOL 160 03/02/2020   TRIG 115 03/02/2020   HDL 36 (L) 03/02/2020   LDLCALC 103 (H) 03/02/2020   ALT 47 (H) 04/13/2020   AST 31 04/13/2020   NA 137 04/13/2020   K 4.1 04/13/2020   CL 106 04/13/2020   CREATININE 1.18 04/13/2020   BUN 16 04/13/2020   CO2 20 (L) 04/13/2020   TSH 1.080 03/02/2020   INR 1.1 04/13/2020   HGBA1C 5.5 04/13/2020    Kerin Perna III, MD 04/15/2020 7:13 AM

## 2020-04-16 ENCOUNTER — Telehealth: Payer: Self-pay | Admitting: Cardiology

## 2020-04-16 ENCOUNTER — Telehealth: Payer: Self-pay

## 2020-04-16 ENCOUNTER — Inpatient Hospital Stay (HOSPITAL_COMMUNITY): Payer: Commercial Managed Care - PPO

## 2020-04-16 ENCOUNTER — Encounter (HOSPITAL_COMMUNITY): Payer: Self-pay | Admitting: Cardiothoracic Surgery

## 2020-04-16 LAB — CBC
HCT: 41.8 % (ref 39.0–52.0)
HCT: 40.1 % (ref 39.0–52.0)
Hemoglobin: 14.4 g/dL (ref 13.0–17.0)
Hemoglobin: 13.4 g/dL (ref 13.0–17.0)
MCH: 32.9 pg (ref 26.0–34.0)
MCH: 32.4 pg (ref 26.0–34.0)
MCHC: 34.4 g/dL (ref 30.0–36.0)
MCHC: 33.4 g/dL (ref 30.0–36.0)
MCV: 95.4 fL (ref 80.0–100.0)
MCV: 96.9 fL (ref 80.0–100.0)
Platelets: 121 10*3/uL — ABNORMAL LOW (ref 150–400)
Platelets: 124 10*3/uL — ABNORMAL LOW (ref 150–400)
RBC: 4.38 MIL/uL (ref 4.22–5.81)
RBC: 4.14 MIL/uL — ABNORMAL LOW (ref 4.22–5.81)
RDW: 12.2 % (ref 11.5–15.5)
RDW: 12.4 % (ref 11.5–15.5)
WBC: 18.3 10*3/uL — ABNORMAL HIGH (ref 4.0–10.5)
WBC: 22.7 10*3/uL — ABNORMAL HIGH (ref 4.0–10.5)
nRBC: 0 % (ref 0.0–0.2)
nRBC: 0 % (ref 0.0–0.2)

## 2020-04-16 LAB — GLUCOSE, CAPILLARY
Glucose-Capillary: 101 mg/dL — ABNORMAL HIGH (ref 70–99)
Glucose-Capillary: 113 mg/dL — ABNORMAL HIGH (ref 70–99)
Glucose-Capillary: 114 mg/dL — ABNORMAL HIGH (ref 70–99)
Glucose-Capillary: 119 mg/dL — ABNORMAL HIGH (ref 70–99)
Glucose-Capillary: 134 mg/dL — ABNORMAL HIGH (ref 70–99)
Glucose-Capillary: 138 mg/dL — ABNORMAL HIGH (ref 70–99)
Glucose-Capillary: 141 mg/dL — ABNORMAL HIGH (ref 70–99)
Glucose-Capillary: 141 mg/dL — ABNORMAL HIGH (ref 70–99)
Glucose-Capillary: 145 mg/dL — ABNORMAL HIGH (ref 70–99)
Glucose-Capillary: 146 mg/dL — ABNORMAL HIGH (ref 70–99)
Glucose-Capillary: 147 mg/dL — ABNORMAL HIGH (ref 70–99)
Glucose-Capillary: 147 mg/dL — ABNORMAL HIGH (ref 70–99)
Glucose-Capillary: 150 mg/dL — ABNORMAL HIGH (ref 70–99)
Glucose-Capillary: 151 mg/dL — ABNORMAL HIGH (ref 70–99)
Glucose-Capillary: 88 mg/dL (ref 70–99)

## 2020-04-16 LAB — POCT I-STAT 7, (LYTES, BLD GAS, ICA,H+H)
Acid-base deficit: 3 mmol/L — ABNORMAL HIGH (ref 0.0–2.0)
Acid-base deficit: 1 mmol/L (ref 0.0–2.0)
Bicarbonate: 22.8 mmol/L (ref 20.0–28.0)
Bicarbonate: 23.9 mmol/L (ref 20.0–28.0)
Calcium, Ion: 1.14 mmol/L — ABNORMAL LOW (ref 1.15–1.40)
Calcium, Ion: 1.14 mmol/L — ABNORMAL LOW (ref 1.15–1.40)
HCT: 40 % (ref 39.0–52.0)
HCT: 40 % (ref 39.0–52.0)
Hemoglobin: 13.6 g/dL (ref 13.0–17.0)
Hemoglobin: 13.6 g/dL (ref 13.0–17.0)
O2 Saturation: 93 %
O2 Saturation: 98 %
Patient temperature: 36.8
Patient temperature: 36.8
Potassium: 4.1 mmol/L (ref 3.5–5.1)
Potassium: 4.6 mmol/L (ref 3.5–5.1)
Sodium: 138 mmol/L (ref 135–145)
Sodium: 137 mmol/L (ref 135–145)
TCO2: 24 mmol/L (ref 22–32)
TCO2: 25 mmol/L (ref 22–32)
pCO2 arterial: 40 mmHg (ref 32.0–48.0)
pCO2 arterial: 40.8 mmHg (ref 32.0–48.0)
pH, Arterial: 7.363 (ref 7.350–7.450)
pH, Arterial: 7.375 (ref 7.350–7.450)
pO2, Arterial: 68 mmHg — ABNORMAL LOW (ref 83.0–108.0)
pO2, Arterial: 98 mmHg (ref 83.0–108.0)

## 2020-04-16 LAB — BASIC METABOLIC PANEL
Anion gap: 9 (ref 5–15)
Anion gap: 7 (ref 5–15)
BUN: 16 mg/dL (ref 8–23)
BUN: 22 mg/dL (ref 8–23)
CO2: 21 mmol/L — ABNORMAL LOW (ref 22–32)
CO2: 26 mmol/L (ref 22–32)
Calcium: 8 mg/dL — ABNORMAL LOW (ref 8.9–10.3)
Calcium: 7.8 mg/dL — ABNORMAL LOW (ref 8.9–10.3)
Chloride: 108 mmol/L (ref 98–111)
Chloride: 103 mmol/L (ref 98–111)
Creatinine, Ser: 1.25 mg/dL — ABNORMAL HIGH (ref 0.61–1.24)
Creatinine, Ser: 1.4 mg/dL — ABNORMAL HIGH (ref 0.61–1.24)
GFR, Estimated: >60 mL/min (ref >60)
GFR, Estimated: 54 mL/min — ABNORMAL LOW (ref >60)
Glucose, Bld: 149 mg/dL — ABNORMAL HIGH (ref 70–99)
Glucose, Bld: 124 mg/dL — ABNORMAL HIGH (ref 70–99)
Potassium: 4.5 mmol/L (ref 3.5–5.1)
Potassium: 4.6 mmol/L (ref 3.5–5.1)
Sodium: 138 mmol/L (ref 135–145)
Sodium: 136 mmol/L (ref 135–145)

## 2020-04-16 LAB — MAGNESIUM
Magnesium: 2.5 mg/dL — ABNORMAL HIGH (ref 1.7–2.4)
Magnesium: 2.3 mg/dL (ref 1.7–2.4)

## 2020-04-16 LAB — COOXEMETRY PANEL
Carboxyhemoglobin: 0.9 % (ref 0.5–1.5)
Methemoglobin: 0.9 % (ref 0.0–1.5)
O2 Saturation: 61.7 %
Total hemoglobin: 14.2 g/dL (ref 12.0–16.0)

## 2020-04-16 MED ORDER — LISINOPRIL 20 MG PO TABS
20.0000 mg | ORAL_TABLET | Freq: Every day | ORAL | Status: DC
  Administered 2020-04-16: 20 mg via ORAL
  Filled 2020-04-16: qty 1

## 2020-04-16 MED ORDER — CARVEDILOL 6.25 MG PO TABS
6.2500 mg | ORAL_TABLET | Freq: Two times a day (BID) | ORAL | Status: DC
  Administered 2020-04-17: 6.25 mg via ORAL
  Filled 2020-04-16: qty 1

## 2020-04-16 MED ORDER — SIMETHICONE 80 MG PO CHEW
80.0000 mg | CHEWABLE_TABLET | Freq: Four times a day (QID) | ORAL | Status: DC
  Administered 2020-04-16 – 2020-04-23 (×24): 80 mg via ORAL
  Filled 2020-04-16 (×24): qty 1

## 2020-04-16 MED ORDER — DOBUTAMINE IN D5W 4-5 MG/ML-% IV SOLN
2.0000 ug/kg/min | INTRAVENOUS | Status: DC
  Administered 2020-04-16: 2.5 ug/kg/min via INTRAVENOUS
  Administered 2020-04-19: 4 ug/kg/min via INTRAVENOUS
  Filled 2020-04-16 (×2): qty 250

## 2020-04-16 MED ORDER — INSULIN DETEMIR 100 UNIT/ML ~~LOC~~ SOLN
10.0000 [IU] | Freq: Every day | SUBCUTANEOUS | Status: DC
  Administered 2020-04-16 – 2020-04-18 (×3): 10 [IU] via SUBCUTANEOUS
  Filled 2020-04-16 (×4): qty 0.1

## 2020-04-16 MED ORDER — FUROSEMIDE 10 MG/ML IJ SOLN
20.0000 mg | Freq: Two times a day (BID) | INTRAMUSCULAR | Status: DC
  Administered 2020-04-16 – 2020-04-18 (×5): 20 mg via INTRAVENOUS
  Filled 2020-04-16 (×5): qty 2

## 2020-04-16 MED ORDER — INSULIN ASPART 100 UNIT/ML ~~LOC~~ SOLN
0.0000 [IU] | SUBCUTANEOUS | Status: DC
  Administered 2020-04-16: 2 [IU] via SUBCUTANEOUS

## 2020-04-16 MED FILL — Heparin Sodium (Porcine) Inj 1000 Unit/ML: INTRAMUSCULAR | Qty: 1 | Status: AC

## 2020-04-16 MED FILL — Electrolyte-R (PH 7.4) Solution: INTRAVENOUS | Qty: 5000 | Status: AC

## 2020-04-16 MED FILL — Lidocaine HCl Local Soln Prefilled Syringe 100 MG/5ML (2%): INTRAMUSCULAR | Qty: 10 | Status: AC

## 2020-04-16 MED FILL — Mannitol IV Soln 20%: INTRAVENOUS | Qty: 500 | Status: AC

## 2020-04-16 MED FILL — Sodium Bicarbonate IV Soln 8.4%: INTRAVENOUS | Qty: 50 | Status: AC

## 2020-04-16 MED FILL — Sodium Chloride IV Soln 0.9%: INTRAVENOUS | Qty: 3000 | Status: AC

## 2020-04-16 NOTE — Telephone Encounter (Signed)
      I went in pt's chart to see when he was admitted

## 2020-04-16 NOTE — Telephone Encounter (Signed)
FMLA form completed and faxed to [phone], beginning leave 04/15/20, RTW date 07/12/20 STD form with [employer] completed and faxed to [phone], beginning leave 04/15/20 through 07/12/20 Original forms mailed to home address

## 2020-04-16 NOTE — Progress Notes (Signed)
EVENING ROUNDS NOTE :     [address]       [place] [address]             [phone]                 1 Day Post-Op Procedure(s) (LRB): Coronary artery bypass grafting times four using left internal mammary artery and endoscopically harvested right saphenous vein graft (N/A) TRANSESOPHAGEAL ECHOCARDIOGRAM (TEE) (N/A)   Total Length of Stay:  LOS: 1 day  Events:   No events Back on dob at 2.5 HR in 70s     BP 120/68   Pulse 73   Temp 98.4 F (36.9 C)   Resp (!) 24   Ht 6\' 1"  (1.854 m)   Wt 120.3 kg   SpO2 97%   BMI 34.99 kg/m   PAP: (21-48)/(5-24) 28/12 CO:  [5.6 L/min-7 L/min] 6.9 L/min CI:  [2.3 L/min/m2-2.9 L/min/m2] 2.9 L/min/m2  Vent Mode: PSV;CPAP FiO2 (%):  [40 %] 40 % Set Rate:  [4 bmp] 4 bmp Vt Set:  [700 mL] 700 mL PEEP:  [5 cmH20] 5 cmH20 Pressure Support:  [10 cmH20] 10 cmH20  . sodium chloride Stopped (04/16/20 1340)  . sodium chloride    . sodium chloride 20 mL/hr at 04/15/20 1530  . DOBUTamine 2.5 mcg/kg/min (04/16/20 1707)  . insulin Stopped (04/16/20 1154)  . lactated ringers 20 mL/hr at 04/16/20 1500  . lactated ringers      I/O last 3 completed shifts: In: 5103.8 [I.V.:2731.2; Blood:1355; IV Piggyback:1017.6] Out: 5577 [Urine:3250; Blood:1807; Chest Tube:520]   CBC Latest Ref Rng & Units 04/16/2020 04/16/2020 04/16/2020  WBC 4.0 - 10.5 K/uL - - 18.3(H)  Hemoglobin 13.0 - 17.0 g/dL 04/18/2020 78.9 38.1  Hematocrit 39 - 52 % 40.0 40.0 41.8  Platelets 150 - 400 K/uL - - 121(L)    BMP Latest Ref Rng & Units 04/16/2020 04/16/2020 04/16/2020  Glucose 70 - 99 mg/dL - - 04/18/2020)  BUN 8 - 23 mg/dL - - 16  Creatinine 510(C - 1.24 mg/dL - - 5.85)  BUN/Creat Ratio 10 - 24 - - -  Sodium 135 - 145 mmol/L 137 138 138  Potassium 3.5 - 5.1 mmol/L 4.6 4.1 4.5  Chloride 98 - 111 mmol/L - - 108  CO2 22 - 32 mmol/L - - 21(L)  Calcium 8.9 - 10.3 mg/dL - - 8.0(L)    ABG    Component Value Date/Time   PHART 7.375 04/16/2020 1004     PCO2ART 40.8 04/16/2020 1004   PO2ART 98 04/16/2020 1004   HCO3 23.9 04/16/2020 1004   TCO2 25 04/16/2020 1004   ACIDBASEDEF 1.0 04/16/2020 1004   O2SAT 61.7 04/16/2020 1656       04/18/2020, MD 04/16/2020 5:15 PM

## 2020-04-16 NOTE — Anesthesia Postprocedure Evaluation (Signed)
Anesthesia Post Note  Patient: [place]  Procedure(s) Performed: Coronary artery bypass grafting times four using left internal mammary artery and endoscopically harvested right saphenous vein graft (N/A Chest) TRANSESOPHAGEAL ECHOCARDIOGRAM (TEE) (N/A )     Patient location during evaluation: SICU Anesthesia Type: General Level of consciousness: awake Pain management: pain level controlled Vital Signs Assessment: post-procedure vital signs reviewed and stable Respiratory status: spontaneous breathing Cardiovascular status: stable Postop Assessment: no apparent nausea or vomiting Anesthetic complications: no   No complications documented.  Last Vitals:  Vitals:   04/16/20 0600 04/16/20 0630  BP: (!) 157/70   Pulse: 83 85  Resp: (!) 23 (!) 33  Temp: 36.8 C 36.8 C  SpO2: 95% 94%    Last Pain:  Vitals:   04/16/20 0413  TempSrc:   PainSc: 3                  Shelton Silvas

## 2020-04-16 NOTE — Op Note (Signed)
NAME: Dan [place], [place] Nelson MEDICAL RECORD [id number] ACCOUNT [id number] DATE OF BIRTH:10-Jun-1956 FACILITY: [facility] LOCATION: [facility] PHYSICIAN:Rafiq Bucklin VAN TRIGT III, MD  OPERATIVE REPORT  DATE OF PROCEDURE:  04/15/2020  OPERATION: 1.  Coronary artery bypass grafting x4 (left internal mammary artery to left anterior descending, saphenous vein graft to diagonal, saphenous vein graft to OM, saphenous vein graft to distal posterior descending). 2.  Endoscopic harvest of right leg greater saphenous vein.  SURGEON:  Kerin Perna, MD  ASSISTANT:  Lowella Dandy, PA-C  PREOPERATIVE DIAGNOSES:  Severe 3-vessel coronary artery disease with progressive angina and positive stress test.  POSTOPERATIVE DIAGNOSES:  Severe 3-vessel coronary artery disease with progressive angina and positive stress test.  CLINICAL NOTE:  The patient is a 64 year old obese, hypertensive male with recent increasing frequency and intensity of chest pain.  Cardiac stress test was positive for ischemia and subsequent cardiac catheterization demonstrated severe multivessel  coronary artery disease with fairly well preserved LV function.  There was no significant valvular disease on echocardiogram.  The patient was referred for evaluation of coronary artery bypass grafting.  After reviewing his images of the cardiac  arteriograms and echocardiogram, I agreed with the recommendation for coronary bypass surgery.  I discussed the procedure of CABG with the patient and his wife including the expected benefits and potential risks.  We discussed the details of surgery  including the use of general anesthesia and cardiopulmonary bypass, the location of the surgical incisions, and the expected postoperative hospital recovery.  We discussed the potential risks to him including the risks of stroke, bleeding, infection,  organ failure, postoperative arrhythmias, postoperative pulmonary problems including pleural effusion, and death.  After  reviewing these issues, he demonstrated his understanding and agreed to proceed with surgery under what I felt was an informed  consent.  OPERATIVE FINDINGS: 1.  Good conduit for grafts. 2.  Difficult coronary disease with small vessels, especially in the LAD and circumflex distribution. 3.  No blood products required for the surgery. 4.  Preservation of normal LV function after separation from cardiopulmonary bypass by TEE.  DESCRIPTION OF PROCEDURE:  The patient was brought from preoperative holding where informed consent was documented and final surgical issues were discussed with the patient and brought to the operating room.  He was placed supine on the operating table  and general anesthesia was induced under invasive hemodynamic monitoring.  The chest, abdomen and legs were prepped with Betadine and draped as a sterile field.  A proper time-out was performed.  A sternal incision was made as the saphenous vein was  harvested endoscopically from the right leg.  The left internal mammary artery was harvested as a pedicle graft from its origin at the subclavian vessels.  It was 1.5 mm vessel with good flow.  The sternal retractor was placed using the deep blades  because of the patient's obese body habitus.  The pericardium was opened and suspended.  Pursestrings were placed in the ascending aorta and right atrium and after heparin was administered the patient was cannulated and placed on bypass.  The coronaries  were identified for grafting, and the patient was cooled to 32 degrees.  Cardioplegia cannulas were placed both antegrade and retrograde cold blood cardioplegia.  The smaller branch of the distal LAD was too small to graft.  The 1st diagonal branch was small and diffusely diseased and not a target for grafting.  The crossclamp was applied.  One liter of cold blood cardioplegia was delivered in split doses between the antegrade  aortic and retrograde coronary sinus catheters.  There was  good cardioplegic arrest and supple temperature dropped less than 12 degrees.   Cardioplegia was delivered every 20 minutes.  The distal coronary anastomoses were performed.  The 1st distal anastomosis was to the distal posterior descending.  There was a proximal 70% stenosis in the right coronary and then there was an 80% stenosis in the mid to distal posterior descending.   The graft was placed distal in the posterior descending before it was too small to graft.  A reverse saphenous vein was sewn end-to-side with running 7-0 Prolene with good flow through the graft.  Cardioplegia was redosed.  The 2nd distal anastomosis was the OM branch of the left circumflex.  This was occluded proximally.  It was 1.5 mm vessel.  Reverse saphenous vein was sewn end-to-side with running 7-0 Prolene with good flow through the graft.  Cardioplegia was redosed.  The 3rd distal anastomosis was to the 2nd diagonal branch of LAD.  There was a proximal 70% stenosis.  Reverse saphenous vein was sewn end-to-side with running 7-0 Prolene with good flow through the graft.  Cardioplegia was redosed.  The 4th distal anastomosis was to the mid to distal LAD.  It was a 1.5 mm vessel, proximal 90% stenosis.  The left IMA pedicle was brought through an opening in the left lateral pericardium and was brought down onto the LAD and sewn end-to-side with  running 8-0 Prolene.  There was good flow through the anastomosis after briefly releasing the pedicle bulldog and the mammary artery.  The bulldog was reapplied, and the pedicle secured the epicardium with 6-0 Prolenes.  Cardioplegia was redosed.  While the cross clamp was still in place 3 proximal vein anastomoses were performed on the ascending aorta using a 4.5 mm punch and running 6-0 Prolene.  Prior to tying down the final proximal anastomosis, air was vented from the coronaries with a dose  of retrograde warm blood cardioplegia.  The crossclamp was removed.  The heart resumed a  spontaneous rhythm.  The vein grafts were de-aired and opened.  Each had good flow, and hemostasis was documented at the proximal and distal sites.  The patient was rewarmed and reperfused.  Temporary pacing wires were applied.  Lungs  were expanded and ventilator was resumed.  The patient was weaned off cardiopulmonary bypass without difficulty with normal hemodynamics and preservation of LV systolic function by echo.  Protamine was administered without adverse reaction.  The  cannulas were removed.  The mediastinum was irrigated.  The superior pericardial fat was closed over the aorta.  Anterior mediastinal and bilateral pleural tubes were placed and brought out through separate incisions.  The patient remained stable.  The sternal wires were placed and the sternum was closed.  There were no hemodynamic difficulties.  The pectoralis fascia was closed with a running #1 Vicryl.  Subcutaneous and skin layers were closed with a running Vicryl  and sterile dressings were applied.  Total cardiopulmonary bypass was 170 minutes.  CN/NUANCE  D:04/16/2020 T:04/16/2020 JOB:[id number]

## 2020-04-17 ENCOUNTER — Inpatient Hospital Stay (HOSPITAL_COMMUNITY): Payer: Commercial Managed Care - PPO

## 2020-04-17 LAB — CBC
HCT: 37.6 % — ABNORMAL LOW (ref 39.0–52.0)
Hemoglobin: 12.7 g/dL — ABNORMAL LOW (ref 13.0–17.0)
MCH: 33.2 pg (ref 26.0–34.0)
MCHC: 33.8 g/dL (ref 30.0–36.0)
MCV: 98.4 fL (ref 80.0–100.0)
Platelets: 109 10*3/uL — ABNORMAL LOW (ref 150–400)
RBC: 3.82 MIL/uL — ABNORMAL LOW (ref 4.22–5.81)
RDW: 12.6 % (ref 11.5–15.5)
WBC: 17.6 10*3/uL — ABNORMAL HIGH (ref 4.0–10.5)
nRBC: 0 % (ref 0.0–0.2)

## 2020-04-17 LAB — COOXEMETRY PANEL
Carboxyhemoglobin: 1.1 % (ref 0.5–1.5)
Methemoglobin: 0.9 % (ref 0.0–1.5)
O2 Saturation: 67.9 %
Total hemoglobin: 12.7 g/dL (ref 12.0–16.0)

## 2020-04-17 LAB — BASIC METABOLIC PANEL
Anion gap: 9 (ref 5–15)
BUN: 28 mg/dL — ABNORMAL HIGH (ref 8–23)
CO2: 24 mmol/L (ref 22–32)
Calcium: 7.9 mg/dL — ABNORMAL LOW (ref 8.9–10.3)
Chloride: 101 mmol/L (ref 98–111)
Creatinine, Ser: 1.72 mg/dL — ABNORMAL HIGH (ref 0.61–1.24)
GFR, Estimated: 42 mL/min — ABNORMAL LOW (ref >60)
Glucose, Bld: 118 mg/dL — ABNORMAL HIGH (ref 70–99)
Potassium: 4.2 mmol/L (ref 3.5–5.1)
Sodium: 134 mmol/L — ABNORMAL LOW (ref 135–145)

## 2020-04-17 LAB — GLUCOSE, CAPILLARY
Glucose-Capillary: 102 mg/dL — ABNORMAL HIGH (ref 70–99)
Glucose-Capillary: 104 mg/dL — ABNORMAL HIGH (ref 70–99)
Glucose-Capillary: 109 mg/dL — ABNORMAL HIGH (ref 70–99)
Glucose-Capillary: 115 mg/dL — ABNORMAL HIGH (ref 70–99)
Glucose-Capillary: 115 mg/dL — ABNORMAL HIGH (ref 70–99)
Glucose-Capillary: 116 mg/dL — ABNORMAL HIGH (ref 70–99)

## 2020-04-17 MED ORDER — MIDODRINE HCL 5 MG PO TABS
10.0000 mg | ORAL_TABLET | Freq: Once | ORAL | Status: AC
  Administered 2020-04-17: 10 mg via ORAL
  Filled 2020-04-17: qty 2

## 2020-04-17 MED ORDER — AMIODARONE HCL IN DEXTROSE 360-4.14 MG/200ML-% IV SOLN
60.0000 mg/h | INTRAVENOUS | Status: DC
  Administered 2020-04-17 (×2): 60 mg/h via INTRAVENOUS
  Filled 2020-04-17 (×2): qty 200

## 2020-04-17 MED ORDER — AMIODARONE HCL IN DEXTROSE 360-4.14 MG/200ML-% IV SOLN
60.0000 mg/h | INTRAVENOUS | Status: DC
  Administered 2020-04-18 (×2): 60 mg/h via INTRAVENOUS
  Filled 2020-04-17 (×2): qty 200

## 2020-04-17 MED ORDER — AMIODARONE LOAD VIA INFUSION
150.0000 mg | Freq: Once | INTRAVENOUS | Status: AC
  Administered 2020-04-17: 150 mg via INTRAVENOUS
  Filled 2020-04-17: qty 83.34

## 2020-04-17 NOTE — Progress Notes (Signed)
      [address]       [place] [address]             [phone]                 2 Days Post-Op Procedure(s) (LRB): Coronary artery bypass grafting times four using left internal mammary artery and endoscopically harvested right saphenous vein graft (N/A) TRANSESOPHAGEAL ECHOCARDIOGRAM (TEE) (N/A)   Events: No events _______________________________________________________________ Vitals: BP 105/62   Pulse 71   Temp 98.3 F (36.8 C)   Resp 17   Ht 6\' 1"  (1.854 m)   Wt 121 kg   SpO2 93%   BMI 35.19 kg/m   - Neuro: alert NAD  - Cardiovascular: sinus  Drips: dob 2.5.   PAP: (28-44)/(12-20) 28/12  - Pulm: EWOB    ABG    Component Value Date/Time   PHART 7.375 04/16/2020 1004   PCO2ART 40.8 04/16/2020 1004   PO2ART 98 04/16/2020 1004   HCO3 23.9 04/16/2020 1004   TCO2 25 04/16/2020 1004   ACIDBASEDEF 1.0 04/16/2020 1004   O2SAT 67.9 04/17/2020 0340    - Abd: soft - Extremity: warm  .Intake/Output      10/15 0701 - 10/16 0700 10/16 0701 - 10/17 0700   P.O. 100    I.V. (mL/kg) 640.2 (5.3) 121.8 (1)   Blood     IV Piggyback 150    Total Intake(mL/kg) 890.2 (7.4) 121.8 (1)   Urine (mL/kg/hr) 895 (0.3) 55 (0.1)   Blood     Chest Tube 380 40   Total Output 1275 95   Net -384.8 +26.8           _______________________________________________________________ Labs: CBC Latest Ref Rng & Units 04/17/2020 04/16/2020 04/16/2020  WBC 4.0 - 10.5 K/uL 17.6(H) 22.7(H) -  Hemoglobin 13.0 - 17.0 g/dL 12.7(L) 13.4 13.6  Hematocrit 39 - 52 % 37.6(L) 40.1 40.0  Platelets 150 - 400 K/uL 109(L) 124(L) -   CMP Latest Ref Rng & Units 04/17/2020 04/16/2020 04/16/2020  Glucose 70 - 99 mg/dL 04/18/2020) 401(U) -  BUN 8 - 23 mg/dL 272(Z) 22 -  Creatinine 0.61 - 1.24 mg/dL 36(U) 4.40(H) -  Sodium 135 - 145 mmol/L 134(L) 136 137  Potassium 3.5 - 5.1 mmol/L 4.2 4.6 4.6  Chloride 98 - 111 mmol/L 101 103 -  CO2 22 - 32 mmol/L 24 26 -  Calcium 8.9 - 10.3 mg/dL  7.9(L) 7.8(L) -  Total Protein 6.5 - 8.1 g/dL - - -  Total Bilirubin 0.3 - 1.2 mg/dL - - -  Alkaline Phos 38 - 126 U/L - - -  AST 15 - 41 U/L - - -  ALT 0 - 44 U/L - - -    CXR: stable  _______________________________________________________________  Assessment and Plan: POD 2 s/p CABG  Neuro: pain controlled CV: on dobt for heart rate.  Will remove wires since no longer conducting Pulm: continue pulm toilet Renal: creat slightly up.  Encouraging PO GI: adding diet Heme: stable ID: [email or web address] Endo: SSI Dispo: continue ICU care for AKI  4.74(Q, MD 04/17/2020 11:55 AM

## 2020-04-18 LAB — GLUCOSE, CAPILLARY
Glucose-Capillary: 100 mg/dL — ABNORMAL HIGH (ref 70–99)
Glucose-Capillary: 78 mg/dL (ref 70–99)
Glucose-Capillary: 89 mg/dL (ref 70–99)
Glucose-Capillary: 89 mg/dL (ref 70–99)
Glucose-Capillary: 92 mg/dL (ref 70–99)
Glucose-Capillary: 98 mg/dL (ref 70–99)

## 2020-04-18 LAB — BASIC METABOLIC PANEL
Anion gap: 7 (ref 5–15)
BUN: 49 mg/dL — ABNORMAL HIGH (ref 8–23)
CO2: 27 mmol/L (ref 22–32)
Calcium: 7.7 mg/dL — ABNORMAL LOW (ref 8.9–10.3)
Chloride: 96 mmol/L — ABNORMAL LOW (ref 98–111)
Creatinine, Ser: 2.35 mg/dL — ABNORMAL HIGH (ref 0.61–1.24)
GFR, Estimated: 29 mL/min — ABNORMAL LOW (ref >60)
Glucose, Bld: 116 mg/dL — ABNORMAL HIGH (ref 70–99)
Potassium: 4.2 mmol/L (ref 3.5–5.1)
Sodium: 130 mmol/L — ABNORMAL LOW (ref 135–145)

## 2020-04-18 LAB — CBC
HCT: 37 % — ABNORMAL LOW (ref 39.0–52.0)
Hemoglobin: 12.3 g/dL — ABNORMAL LOW (ref 13.0–17.0)
MCH: 32.1 pg (ref 26.0–34.0)
MCHC: 33.2 g/dL (ref 30.0–36.0)
MCV: 96.6 fL (ref 80.0–100.0)
Platelets: 140 10*3/uL — ABNORMAL LOW (ref 150–400)
RBC: 3.83 MIL/uL — ABNORMAL LOW (ref 4.22–5.81)
RDW: 12.4 % (ref 11.5–15.5)
WBC: 19.1 10*3/uL — ABNORMAL HIGH (ref 4.0–10.5)
nRBC: 0 % (ref 0.0–0.2)

## 2020-04-18 LAB — MAGNESIUM: Magnesium: 2.6 mg/dL — ABNORMAL HIGH (ref 1.7–2.4)

## 2020-04-18 LAB — COOXEMETRY PANEL
Carboxyhemoglobin: 1 % (ref 0.5–1.5)
Methemoglobin: 0.8 % (ref 0.0–1.5)
O2 Saturation: 47.1 %
Total hemoglobin: 12.7 g/dL (ref 12.0–16.0)

## 2020-04-18 MED ORDER — AMIODARONE HCL IN DEXTROSE 360-4.14 MG/200ML-% IV SOLN
30.0000 mg/h | INTRAVENOUS | Status: DC
  Administered 2020-04-18 – 2020-04-19 (×5): 30 mg/h via INTRAVENOUS
  Filled 2020-04-18 (×3): qty 200

## 2020-04-18 MED ORDER — MORPHINE SULFATE (PF) 2 MG/ML IV SOLN: 1.0000 mg | INTRAVENOUS | Status: DC | PRN

## 2020-04-18 MED ORDER — MIDODRINE HCL 5 MG PO TABS
10.0000 mg | ORAL_TABLET | Freq: Three times a day (TID) | ORAL | Status: DC
  Administered 2020-04-18 – 2020-04-20 (×6): 10 mg via ORAL
  Filled 2020-04-18 (×7): qty 2

## 2020-04-18 MED ORDER — ALBUMIN HUMAN 25 % IV SOLN
12.5000 g | Freq: Once | INTRAVENOUS | Status: AC
  Administered 2020-04-18: 12.5 g via INTRAVENOUS
  Filled 2020-04-18: qty 50

## 2020-04-18 NOTE — Progress Notes (Signed)
EVENING ROUNDS NOTE :     [address]       [place] [address]             [phone]                 3 Days Post-Op Procedure(s) (LRB): Coronary artery bypass grafting times four using left internal mammary artery and endoscopically harvested right saphenous vein graft (N/A) TRANSESOPHAGEAL ECHOCARDIOGRAM (TEE) (N/A)   Total Length of Stay:  LOS: 3 days  Events:   uop improved with 25% albumin On dob at 4 now Remains brady    BP 119/76   Pulse 60   Temp 98.2 F (36.8 C) (Oral)   Resp 13   Ht 6\' 1"  (1.854 m)   Wt 121.5 kg   SpO2 99%   BMI 35.34 kg/m         . sodium chloride Stopped (04/16/20 1340)  . sodium chloride    . sodium chloride 20 mL/hr at 04/15/20 1530  . amiodarone 30 mg/hr (04/18/20 1900)  . DOBUTamine 4 mcg/kg/min (04/18/20 1900)  . insulin Stopped (04/16/20 1154)  . lactated ringers Stopped (04/18/20 1141)  . lactated ringers      I/O last 3 completed shifts: In: 1719 [I.V.:1679.8; IV Piggyback:39.2] Out: 2250 [Urine:1940; Chest Tube:310]   CBC Latest Ref Rng & Units 04/18/2020 04/17/2020 04/16/2020  WBC 4.0 - 10.5 K/uL 19.1(H) 17.6(H) 22.7(H)  Hemoglobin 13.0 - 17.0 g/dL 12.3(L) 12.7(L) 13.4  Hematocrit 39 - 52 % 37.0(L) 37.6(L) 40.1  Platelets 150 - 400 K/uL 140(L) 109(L) 124(L)    BMP Latest Ref Rng & Units 04/18/2020 04/17/2020 04/16/2020  Glucose 70 - 99 mg/dL 04/18/2020) 130(Q) 657(Q)  BUN 8 - 23 mg/dL 469(G) 29(B) 22  Creatinine 0.61 - 1.24 mg/dL 28(U) 1.32(G) 4.01(U)  BUN/Creat Ratio 10 - 24 - - -  Sodium 135 - 145 mmol/L 130(L) 134(L) 136  Potassium 3.5 - 5.1 mmol/L 4.2 4.2 4.6  Chloride 98 - 111 mmol/L 96(L) 101 103  CO2 22 - 32 mmol/L 27 24 26   Calcium 8.9 - 10.3 mg/dL 7.7(L) 7.9(L) 7.8(L)    ABG    Component Value Date/Time   PHART 7.375 04/16/2020 1004   PCO2ART 40.8 04/16/2020 1004   PO2ART 98 04/16/2020 1004   HCO3 23.9 04/16/2020 1004   TCO2 25 04/16/2020 1004   ACIDBASEDEF 1.0 04/16/2020 1004    O2SAT 47.1 04/18/2020 0325       04/18/2020, MD 04/18/2020 7:54 PM

## 2020-04-18 NOTE — Progress Notes (Signed)
      [address]       [place] [address]             [phone]                 3 Days Post-Op Procedure(s) (LRB): Coronary artery bypass grafting times four using left internal mammary artery and endoscopically harvested right saphenous vein graft (N/A) TRANSESOPHAGEAL ECHOCARDIOGRAM (TEE) (N/A)   Events: afib overnight.  Low BP and HR overnight.  Back in sinus this am _______________________________________________________________ Vitals: BP 125/64   Pulse 60   Temp 98 F (36.7 C) (Oral)   Resp 16   Ht 6\' 1"  (1.854 m)   Wt 121.5 kg   SpO2 97%   BMI 35.34 kg/m   - Neuro: alert NAD  - Cardiovascular: sinus  Drips: dob 2.5.      - Pulm: EWOB    ABG    Component Value Date/Time   PHART 7.375 04/16/2020 1004   PCO2ART 40.8 04/16/2020 1004   PO2ART 98 04/16/2020 1004   HCO3 23.9 04/16/2020 1004   TCO2 25 04/16/2020 1004   ACIDBASEDEF 1.0 04/16/2020 1004   O2SAT 47.1 04/18/2020 0325    - Abd: soft - Extremity: warm  .Intake/Output      10/16 0701 - 10/17 0700 10/17 0701 - 10/18 0700   P.O.     I.V. (mL/kg) 1155.4 (9.5)    IV Piggyback     Total Intake(mL/kg) 1155.4 (9.5)    Urine (mL/kg/hr) 610 (0.2) 200 (0.4)   Chest Tube 230 0   Total Output 840 200   Net +315.4 -200           _______________________________________________________________ Labs: CBC Latest Ref Rng & Units 04/18/2020 04/17/2020 04/16/2020  WBC 4.0 - 10.5 K/uL 19.1(H) 17.6(H) 22.7(H)  Hemoglobin 13.0 - 17.0 g/dL 12.3(L) 12.7(L) 13.4  Hematocrit 39 - 52 % 37.0(L) 37.6(L) 40.1  Platelets 150 - 400 K/uL 140(L) 109(L) 124(L)   CMP Latest Ref Rng & Units 04/18/2020 04/17/2020 04/16/2020  Glucose 70 - 99 mg/dL 04/18/2020) 433(I) 951(O)  BUN 8 - 23 mg/dL 841(Y) 60(Y) 22  Creatinine 0.61 - 1.24 mg/dL 30(Z) 6.01(U) 9.32(T)  Sodium 135 - 145 mmol/L 130(L) 134(L) 136  Potassium 3.5 - 5.1 mmol/L 4.2 4.2 4.6  Chloride 98 - 111 mmol/L 96(L) 101 103  CO2 22 - 32 mmol/L 27  24 26   Calcium 8.9 - 10.3 mg/dL 7.7(L) 7.9(L) 7.8(L)  Total Protein 6.5 - 8.1 g/dL - - -  Total Bilirubin 0.3 - 1.2 mg/dL - - -  Alkaline Phos 38 - 126 U/L - - -  AST 15 - 41 U/L - - -  ALT 0 - 44 U/L - - -    CXR: pending  _______________________________________________________________  Assessment and Plan: POD 3 s/p CABG  Neuro: pain controlled CV: on dobt for heart rate.   Pulm: continue pulm toilet Renal: creat still going up.  Will give 25% albumin.  Holding lasix GI:on diet Heme: stable ID: afebrile Endo: SSI Dispo: continue ICU care for AKI  5.57(D, MD 04/18/2020 10:46 AM

## 2020-04-19 ENCOUNTER — Other Ambulatory Visit: Payer: Self-pay

## 2020-04-19 LAB — CBC
HCT: 37.1 % — ABNORMAL LOW (ref 39.0–52.0)
Hemoglobin: 13 g/dL (ref 13.0–17.0)
MCH: 32.9 pg (ref 26.0–34.0)
MCHC: 35 g/dL (ref 30.0–36.0)
MCV: 93.9 fL (ref 80.0–100.0)
Platelets: 153 10*3/uL (ref 150–400)
RBC: 3.95 MIL/uL — ABNORMAL LOW (ref 4.22–5.81)
RDW: 12.1 % (ref 11.5–15.5)
WBC: 12.8 10*3/uL — ABNORMAL HIGH (ref 4.0–10.5)
nRBC: 0 % (ref 0.0–0.2)

## 2020-04-19 LAB — GLUCOSE, CAPILLARY
Glucose-Capillary: 92 mg/dL (ref 70–99)
Glucose-Capillary: 94 mg/dL (ref 70–99)
Glucose-Capillary: 100 mg/dL — ABNORMAL HIGH (ref 70–99)
Glucose-Capillary: 107 mg/dL — ABNORMAL HIGH (ref 70–99)
Glucose-Capillary: 97 mg/dL (ref 70–99)

## 2020-04-19 LAB — BASIC METABOLIC PANEL
Anion gap: 9 (ref 5–15)
BUN: 37 mg/dL — ABNORMAL HIGH (ref 8–23)
CO2: 25 mmol/L (ref 22–32)
Calcium: 8.1 mg/dL — ABNORMAL LOW (ref 8.9–10.3)
Chloride: 98 mmol/L (ref 98–111)
Creatinine, Ser: 1.4 mg/dL — ABNORMAL HIGH (ref 0.61–1.24)
GFR, Estimated: 54 mL/min — ABNORMAL LOW (ref >60)
Glucose, Bld: 107 mg/dL — ABNORMAL HIGH (ref 70–99)
Potassium: 3.8 mmol/L (ref 3.5–5.1)
Sodium: 132 mmol/L — ABNORMAL LOW (ref 135–145)

## 2020-04-19 LAB — COOXEMETRY PANEL
Carboxyhemoglobin: 1.2 % (ref 0.5–1.5)
Methemoglobin: 1 % (ref 0.0–1.5)
O2 Saturation: 65.9 %
Total hemoglobin: 13.2 g/dL (ref 12.0–16.0)

## 2020-04-19 MED ORDER — LACTULOSE 10 GM/15ML PO SOLN: 20.0000 g | Freq: Every day | ORAL | Status: DC | PRN

## 2020-04-19 MED ORDER — GUAIFENESIN ER 600 MG PO TB12
1200.0000 mg | ORAL_TABLET | Freq: Two times a day (BID) | ORAL | Status: DC
  Administered 2020-04-19 – 2020-04-23 (×9): 1200 mg via ORAL
  Filled 2020-04-19 (×10): qty 2

## 2020-04-19 MED ORDER — METOCLOPRAMIDE HCL 5 MG/ML IJ SOLN
10.0000 mg | Freq: Three times a day (TID) | INTRAMUSCULAR | Status: DC
  Administered 2020-04-19 (×2): 10 mg via INTRAVENOUS
  Filled 2020-04-19 (×3): qty 2

## 2020-04-19 MED ORDER — AMIODARONE LOAD VIA INFUSION
150.0000 mg | Freq: Once | INTRAVENOUS | Status: AC
  Administered 2020-04-19: 150 mg via INTRAVENOUS
  Filled 2020-04-19: qty 83.34

## 2020-04-19 MED ORDER — ENOXAPARIN SODIUM 40 MG/0.4ML ~~LOC~~ SOLN
40.0000 mg | SUBCUTANEOUS | Status: DC
  Administered 2020-04-19 – 2020-04-22 (×4): 40 mg via SUBCUTANEOUS
  Filled 2020-04-19 (×4): qty 0.4

## 2020-04-19 MED ORDER — INSULIN ASPART 100 UNIT/ML ~~LOC~~ SOLN: 0.0000 [IU] | Freq: Three times a day (TID) | SUBCUTANEOUS | Status: DC

## 2020-04-19 MED ORDER — INFLUENZA VAC SPLIT QUAD 0.5 ML IM SUSY
0.5000 mL | PREFILLED_SYRINGE | INTRAMUSCULAR | Status: AC
  Administered 2020-04-23: 0.5 mL via INTRAMUSCULAR
  Filled 2020-04-19 (×2): qty 0.5

## 2020-04-19 MED ORDER — POTASSIUM CHLORIDE CRYS ER 20 MEQ PO TBCR
40.0000 meq | EXTENDED_RELEASE_TABLET | Freq: Once | ORAL | Status: AC
  Administered 2020-04-19: 40 meq via ORAL
  Filled 2020-04-19: qty 2

## 2020-04-19 NOTE — Progress Notes (Signed)
Sitting up in chair post ambulation and patient's heart rate increased 160's SVT. Blood pressure stable. Fluctuation in heart rate 130-160's. Intermittent atrial fib and then sinus tach. Asymptomatic on amiodarone drip. Patient eating breakfast, A&Ox4.

## 2020-04-19 NOTE — Progress Notes (Signed)
Patient ID: Dan Nelson, male   DOB: 06-02-1956, 64 y.o.   MRN: [id number] EVENING ROUNDS NOTE :     [address]       [place] [address]             [phone]                 4 Days Post-Op Procedure(s) (LRB): Coronary artery bypass grafting times four using left internal mammary artery and endoscopically harvested right saphenous vein graft (N/A) TRANSESOPHAGEAL ECHOCARDIOGRAM (TEE) (N/A)  Total Length of Stay:  LOS: 4 days  BP 139/67 (BP Location: Right Arm)   Pulse 66   Temp 99.4 F (37.4 C) (Oral)   Resp (!) 22   Ht 6\' 1"  (1.854 m)   Wt 121 kg   SpO2 100%   BMI 35.19 kg/m   .Intake/Output      10/17 0701 - 10/18 0700 10/18 0701 - 10/19 0700   I.V. (mL/kg) 809.5 (6.7) 235.8 (1.9)   IV Piggyback 39.2    Total Intake(mL/kg) 848.7 (7) 235.8 (1.9)   Urine (mL/kg/hr) 2730 (0.9) 415 (0.3)   Stool  0   Chest Tube 80    Total Output 2810 415   Net -1961.3 -179.2        Stool Occurrence  1 x     . sodium chloride    . amiodarone 30 mg/hr (04/19/20 1500)     Lab Results  Component Value Date   WBC 12.8 (H) 04/19/2020   HGB 13.0 04/19/2020   HCT 37.1 (L) 04/19/2020   PLT 153 04/19/2020   GLUCOSE 107 (H) 04/19/2020   CHOL 160 03/02/2020   TRIG 115 03/02/2020   HDL 36 (L) 03/02/2020   LDLCALC 103 (H) 03/02/2020   ALT 47 (H) 04/13/2020   AST 31 04/13/2020   NA 132 (L) 04/19/2020   K 3.8 04/19/2020   CL 98 04/19/2020   CREATININE 1.40 (H) 04/19/2020   BUN 37 (H) 04/19/2020   CO2 25 04/19/2020   TSH 1.080 03/02/2020   INR 1.4 (H) 04/15/2020   HGBA1C 5.5 04/13/2020   In sinus , up to cahir On iv Cordarone    06/13/2020 MD  Beeper [phone] Office [phone] 04/19/2020 6:14 PM

## 2020-04-19 NOTE — Progress Notes (Signed)
Report given to Brantley Fling, RNs and they are both aware of afib RVR this morning after ambulation and that metoprolol PRN IV was given and patient's heart rate now improved in low 100's.

## 2020-04-19 NOTE — Progress Notes (Addendum)
[facility] DAILY ICU PROGRESS NOTE                   [address]            [place] [address]          [phone]   4 Days Post-Op Procedure(s) (LRB): Coronary artery bypass grafting times four using left internal mammary artery and endoscopically harvested right saphenous vein graft (N/A) TRANSESOPHAGEAL ECHOCARDIOGRAM (TEE) (N/A)  Total Length of Stay:  LOS: 4 days   Subjective:  Up in chair doing okay.  Was able to eat breakfast this morning.  He has ambulated, and developed Atrial Fibrillation after walk this morning.  No BM yet, + gas  Objective: Vital signs in last 24 hours: Temp:  [98 F (36.7 C)-99.1 F (37.3 C)] 98.6 F (37 C) (10/18 0400) Pulse Rate:  [56-162] 126 (10/18 0707) Cardiac Rhythm: Sinus tachycardia (10/18 0657) Resp:  [12-29] 22 (10/18 0707) BP: (106-148)/(50-105) 134/64 (10/18 0700) SpO2:  [95 %-100 %] 97 % (10/18 0707) Weight:  [121 kg] 121 kg (10/18 0500)  Filed Weights   04/17/20 0500 04/18/20 0500 04/19/20 0500  Weight: 121 kg 121.5 kg 121 kg    Weight change: -0.5 kg   Intake/Output from previous day: 10/17 0701 - 10/18 0700 In: 848.7 [I.V.:809.5; IV Piggyback:39.2] Out: 2810 [Urine:2730; Chest Tube:80]  Current Meds: Scheduled Meds: . acetaminophen  1,000 mg Oral Q6H   Or  . acetaminophen (TYLENOL) oral liquid 160 mg/5 mL  1,000 mg Per Tube Q6H  . aspirin EC  325 mg Oral Daily   Or  . aspirin  324 mg Per Tube Daily  . bisacodyl  10 mg Oral Daily   Or  . bisacodyl  10 mg Rectal Daily  . Chlorhexidine Gluconate Cloth  6 each Topical Daily  . docusate sodium  200 mg Oral Daily  . ezetimibe  10 mg Oral Daily  . insulin aspart  0-24 Units Subcutaneous TID WC  . mouth rinse  15 mL Mouth Rinse BID  . metoCLOPramide (REGLAN) injection  10 mg Intravenous Q6H  . midodrine  10 mg Oral TID WC  . mupirocin ointment  1 application Nasal BID  . pantoprazole  40 mg Oral Daily  . potassium chloride  40 mEq Oral Once  .  rosuvastatin  20 mg Oral Daily  . simethicone  80 mg Oral QID  . sodium chloride flush  3 mL Intravenous Q12H   Continuous Infusions: . sodium chloride Stopped (04/16/20 1340)  . sodium chloride    . sodium chloride 20 mL/hr at 04/15/20 1530  . amiodarone 30 mg/hr (04/19/20 0700)  . DOBUTamine 4 mcg/kg/min (04/19/20 0700)  . lactated ringers Stopped (04/18/20 1141)  . lactated ringers     PRN Meds:.sodium chloride, metoprolol tartrate, morphine injection, ondansetron (ZOFRAN) IV, oxyCODONE, sodium chloride flush, traMADol  General appearance: alert, cooperative and no distress Heart: irregularly irregular rhythm Lungs: caorse, but clear with cough Abdomen: soft, non-tender; bowel sounds normal; no masses,  no organomegaly Extremities: edema +1 edema Wound: clean and dry  Lab Results: CBC: Recent Labs    04/18/20 0337 04/19/20 0511  WBC 19.1* 12.8*  HGB 12.3* 13.0  HCT 37.0* 37.1*  PLT 140* 153   BMET:  Recent Labs    04/18/20 0337 04/19/20 0511  NA 130* 132*  K 4.2 3.8  CL 96* 98  CO2 27 25  GLUCOSE 116* 107*  BUN 49* 37*  CREATININE 2.35*  1.40*  CALCIUM 7.7* 8.1*    CMET: Lab Results  Component Value Date   WBC 12.8 (H) 04/19/2020   HGB 13.0 04/19/2020   HCT 37.1 (L) 04/19/2020   PLT 153 04/19/2020   GLUCOSE 107 (H) 04/19/2020   CHOL 160 03/02/2020   TRIG 115 03/02/2020   HDL 36 (L) 03/02/2020   LDLCALC 103 (H) 03/02/2020   ALT 47 (H) 04/13/2020   AST 31 04/13/2020   NA 132 (L) 04/19/2020   K 3.8 04/19/2020   CL 98 04/19/2020   CREATININE 1.40 (H) 04/19/2020   BUN 37 (H) 04/19/2020   CO2 25 04/19/2020   TSH 1.080 03/02/2020   INR 1.4 (H) 04/15/2020   HGBA1C 5.5 04/13/2020      PT/INR: No results for input(s): LABPROT, INR in the last 72 hours. Radiology: No results found.   Assessment/Plan: S/P Procedure(s) (LRB): Coronary artery bypass grafting times four using left internal mammary artery and endoscopically harvested right saphenous  vein graft (N/A) TRANSESOPHAGEAL ECHOCARDIOGRAM (TEE) (N/A)  1. CV- Atrial Fibrillation- on Dobutamine at 4, on Amiodarone, Midodrine for BP support, wean Dobutamine as tolerated, continue Amiodarone drip for now 2. Pulm- + congestion, wean oxygen as tolerated, continue IS, will add Mucinex 3. Renal- creatinine improved to 1.40, U/O improved, his K is borderline at 3.8, supplementation ordered per pharmacy 4. CBGs controlled, patient is not a diabetic, will stop Levemir, transition SSIP to 4x/day coverage, can hopefully fully discontinue soon 5. Dispo- patient stable, weaning Dobutamine as tolerated, creatinine improved, supplement K, continue Amiodarone for Atrial Fibrillation     Erin Barrett, PA-C 04/19/2020 7:40 AM   Overall improvement rebolus amio for afib, wean off dobutamine DC foley May transfer to stepdown later when back in nsr  patient examined and medical record reviewed,agree with above note. Kathlee Nations Trigt III 04/19/2020

## 2020-04-20 ENCOUNTER — Inpatient Hospital Stay (HOSPITAL_COMMUNITY): Payer: Commercial Managed Care - PPO

## 2020-04-20 LAB — CBC
HCT: 34.5 % — ABNORMAL LOW (ref 39.0–52.0)
Hemoglobin: 12 g/dL — ABNORMAL LOW (ref 13.0–17.0)
MCH: 32.9 pg (ref 26.0–34.0)
MCHC: 34.8 g/dL (ref 30.0–36.0)
MCV: 94.5 fL (ref 80.0–100.0)
Platelets: 158 10*3/uL (ref 150–400)
RBC: 3.65 MIL/uL — ABNORMAL LOW (ref 4.22–5.81)
RDW: 12.1 % (ref 11.5–15.5)
WBC: 9.5 10*3/uL (ref 4.0–10.5)
nRBC: 0 % (ref 0.0–0.2)

## 2020-04-20 LAB — BASIC METABOLIC PANEL
Anion gap: 8 (ref 5–15)
BUN: 27 mg/dL — ABNORMAL HIGH (ref 8–23)
CO2: 26 mmol/L (ref 22–32)
Calcium: 7.8 mg/dL — ABNORMAL LOW (ref 8.9–10.3)
Chloride: 98 mmol/L (ref 98–111)
Creatinine, Ser: 1.19 mg/dL (ref 0.61–1.24)
GFR, Estimated: >60 mL/min (ref >60)
Glucose, Bld: 148 mg/dL — ABNORMAL HIGH (ref 70–99)
Potassium: 3.6 mmol/L (ref 3.5–5.1)
Sodium: 132 mmol/L — ABNORMAL LOW (ref 135–145)

## 2020-04-20 LAB — GLUCOSE, CAPILLARY
Glucose-Capillary: 100 mg/dL — ABNORMAL HIGH (ref 70–99)
Glucose-Capillary: 105 mg/dL — ABNORMAL HIGH (ref 70–99)
Glucose-Capillary: 151 mg/dL — ABNORMAL HIGH (ref 70–99)
Glucose-Capillary: 93 mg/dL (ref 70–99)
Glucose-Capillary: 99 mg/dL (ref 70–99)

## 2020-04-20 LAB — COOXEMETRY PANEL
Carboxyhemoglobin: 1.4 % (ref 0.5–1.5)
Methemoglobin: 1 % (ref 0.0–1.5)
O2 Saturation: 63.7 %
Total hemoglobin: 12.6 g/dL (ref 12.0–16.0)

## 2020-04-20 MED ORDER — POTASSIUM CHLORIDE CRYS ER 20 MEQ PO TBCR
20.0000 meq | EXTENDED_RELEASE_TABLET | Freq: Every day | ORAL | Status: DC
  Administered 2020-04-20 – 2020-04-23 (×4): 20 meq via ORAL
  Filled 2020-04-20 (×4): qty 1

## 2020-04-20 MED ORDER — FUROSEMIDE 10 MG/ML IJ SOLN
40.0000 mg | Freq: Once | INTRAMUSCULAR | Status: AC
  Administered 2020-04-20: 40 mg via INTRAVENOUS
  Filled 2020-04-20: qty 4

## 2020-04-20 MED ORDER — FUROSEMIDE 40 MG PO TABS
40.0000 mg | ORAL_TABLET | Freq: Every day | ORAL | Status: DC
  Administered 2020-04-21 – 2020-04-23 (×3): 40 mg via ORAL
  Filled 2020-04-20 (×3): qty 1

## 2020-04-20 MED ORDER — AMIODARONE HCL 200 MG PO TABS
200.0000 mg | ORAL_TABLET | Freq: Two times a day (BID) | ORAL | Status: DC
  Administered 2020-04-20 – 2020-04-23 (×7): 200 mg via ORAL
  Filled 2020-04-20 (×7): qty 1

## 2020-04-20 NOTE — Progress Notes (Signed)
CARDIAC REHAB PHASE I   PRE:  Rate/Rhythm: 71 SR  BP:  Supine:   Sitting: 118/65  Standing:    SaO2: 95%RA  MODE:  Ambulation: 350 ft   POST:  Rate/Rhythm: 90 SR  BP:  Supine:   Sitting: 170/72  Standing:    SaO2: 97%RA 0925-0950 Observed pt up in room.  Assisted to bathroom and then he walked 350 ft on RA with rollator. Tolerated well. Back to recliner and then pt had to go to bathroom again to void. Assisted back to recliner and urinal at side.  Remained in NSR.   Luetta Nutting, RN BSN  04/20/2020 9:48 AM

## 2020-04-20 NOTE — Progress Notes (Signed)
Mobility Specialist - Progress Note   04/20/20 1428  Mobility  Activity Ambulated in hall  Level of Assistance Modified independent, requires aide device or extra time  Assistive Device Front wheel walker  Distance Ambulated (ft) 410 ft  Mobility Response Tolerated well  Mobility performed by Mobility specialist  $Mobility charge 1 Mobility    Pre-mobility: 72 HR, 136/66 BP During mobility: 97 HR Post-mobility: 74 HR, 165/69 BP  Pt asx throughout ambulation. Says he has only been using his RW for longer walks. Pt back in bed after walk.  Dan Nelson Mobility Specialist Mobility Specialist Phone: [phone]

## 2020-04-20 NOTE — Progress Notes (Signed)
Right IJ discontinued as per verbal order from Dr. Maren Beach.  Pt tolerated well. Pt on bedrest until 1400.

## 2020-04-20 NOTE — Progress Notes (Addendum)
      [address]       [place] [address]             [phone]      5 Days Post-Op Procedure(s) (LRB): Coronary artery bypass grafting times four using left internal mammary artery and endoscopically harvested right saphenous vein graft (N/A) TRANSESOPHAGEAL ECHOCARDIOGRAM (TEE) (N/A)   Subjective:  Patient feeling better.  Is hoping to get into the chair soon as he states he feels better up rather than lying down  Objective: Vital signs in last 24 hours: Temp:  [98.1 F (36.7 C)-100.3 F (37.9 C)] 98.1 F (36.7 C) (10/19 0430) Pulse Rate:  [58-119] 91 (10/19 0430) Cardiac Rhythm: Other (Comment) (10/19 0448) Resp:  [16-33] 20 (10/19 0430) BP: (96-149)/(39-91) 105/57 (10/19 0430) SpO2:  [96 %-100 %] 97 % (10/19 0430) Weight:  [121.2 kg] 121.2 kg (10/19 0058)  Intake/Output from previous day: 10/18 0701 - 10/19 0700 In: 575.8 [P.O.:140; I.V.:435.8] Out: 2140 [Urine:2140]  General appearance: alert, cooperative and no distress Heart: regular rate and rhythm Lungs: clear to auscultation bilaterally Abdomen: soft, non-tender; bowel sounds normal; no masses,  no organomegaly Extremities: edema pitting Wound: clean and dry  Lab Results: Recent Labs    04/19/20 0511 04/20/20 0500  WBC 12.8* 9.5  HGB 13.0 12.0*  HCT 37.1* 34.5*  PLT 153 158   BMET:  Recent Labs    04/19/20 0511 04/20/20 0500  NA 132* 132*  K 3.8 3.6  CL 98 98  CO2 25 26  GLUCOSE 107* 148*  BUN 37* 27*  CREATININE 1.40* 1.19  CALCIUM 8.1* 7.8*    PT/INR: No results for input(s): LABPROT, INR in the last 72 hours. ABG    Component Value Date/Time   PHART 7.375 04/16/2020 1004   HCO3 23.9 04/16/2020 1004   TCO2 25 04/16/2020 1004   ACIDBASEDEF 1.0 04/16/2020 1004   O2SAT 63.7 04/20/2020 0537   CBG (last 3)  Recent Labs    04/19/20 2016 04/19/20 2356 04/20/20 0641  GLUCAP 97 100* 93    Assessment/Plan: S/P Procedure(s) (LRB): Coronary artery bypass  grafting times four using left internal mammary artery and endoscopically harvested right saphenous vein graft (N/A) TRANSESOPHAGEAL ECHOCARDIOGRAM (TEE) (N/A)  1. CV- PAF, currently in NSR- will transition to oral Amiodarone 2. Pulm- no acute issues, off oxygen 3. Renal- creatinine stable, weight is trending up. . Will give a dose of IV Lasix, add TED HOSE 4. CBGs controlled, patient isn't a diabetic will d/c SSIP 5. Dispo- patient stable, will transition to oral Amiodarone as he is maintaining NSR, creatinine improved will start diuretics as patient is volume overloaded, supplement K, will d/c EPW in AM if patient remains in NSR, d/c central line in AM, possibly ready for d/c on Thursday   LOS: 5 days   Lowella Dandy, PA-C 04/20/2020  nsr- central line is out HR remains slow- no beta blocker but he needs low dose    amiodarone for postop afib Creat normal- start po lasix  patient examined and medical record reviewed,agree with above note. Kathlee Nations Trigt III 04/20/2020

## 2020-04-20 NOTE — Progress Notes (Signed)
TED hose applied per order.  S/p insertion site with scant serosanguinous drainage and dry dressing applied prior to TED hose.   Teaching completed to apply in am before getting OOB and remove at bedtime.

## 2020-04-20 NOTE — Progress Notes (Signed)
Patient transferred from 2 Heart to RM 19 accompanied by RN,alert and oriented,attached to cardiac monitoring,V/S checked,oriented to the room and call bell within reach.Will continue to monitor.

## 2020-04-21 ENCOUNTER — Encounter (HOSPITAL_COMMUNITY): Payer: Self-pay | Admitting: Cardiothoracic Surgery

## 2020-04-21 LAB — BASIC METABOLIC PANEL
Anion gap: 9 (ref 5–15)
BUN: 28 mg/dL — ABNORMAL HIGH (ref 8–23)
CO2: 26 mmol/L (ref 22–32)
Calcium: 8.6 mg/dL — ABNORMAL LOW (ref 8.9–10.3)
Chloride: 102 mmol/L (ref 98–111)
Creatinine, Ser: 1.35 mg/dL — ABNORMAL HIGH (ref 0.61–1.24)
GFR, Estimated: 56 mL/min — ABNORMAL LOW (ref >60)
Glucose, Bld: 102 mg/dL — ABNORMAL HIGH (ref 70–99)
Potassium: 3.6 mmol/L (ref 3.5–5.1)
Sodium: 137 mmol/L (ref 135–145)

## 2020-04-21 LAB — URINALYSIS, ROUTINE W REFLEX MICROSCOPIC
Bacteria, UA: NONE SEEN
Bilirubin Urine: NEGATIVE
Glucose, UA: NEGATIVE mg/dL
Ketones, ur: NEGATIVE mg/dL
Leukocytes,Ua: NEGATIVE
Nitrite: NEGATIVE
Protein, ur: NEGATIVE mg/dL
Specific Gravity, Urine: 1.014 (ref 1.005–1.030)
pH: 5 (ref 5.0–8.0)

## 2020-04-21 LAB — CBC
HCT: 38.9 % — ABNORMAL LOW (ref 39.0–52.0)
Hemoglobin: 13.3 g/dL (ref 13.0–17.0)
MCH: 31.9 pg (ref 26.0–34.0)
MCHC: 34.2 g/dL (ref 30.0–36.0)
MCV: 93.3 fL (ref 80.0–100.0)
Platelets: 216 10*3/uL (ref 150–400)
RBC: 4.17 MIL/uL — ABNORMAL LOW (ref 4.22–5.81)
RDW: 12 % (ref 11.5–15.5)
WBC: 12.1 10*3/uL — ABNORMAL HIGH (ref 4.0–10.5)
nRBC: 0 % (ref 0.0–0.2)

## 2020-04-21 LAB — GLUCOSE, CAPILLARY: Glucose-Capillary: 110 mg/dL — ABNORMAL HIGH (ref 70–99)

## 2020-04-21 MED ORDER — CARVEDILOL 3.125 MG PO TABS
3.1250 mg | ORAL_TABLET | Freq: Two times a day (BID) | ORAL | Status: DC
  Administered 2020-04-21 – 2020-04-23 (×5): 3.125 mg via ORAL
  Filled 2020-04-21 (×5): qty 1

## 2020-04-21 MED ORDER — DOXAZOSIN MESYLATE 2 MG PO TABS: 2.0000 mg | ORAL_TABLET | Freq: Every day | ORAL | Status: DC

## 2020-04-21 MED ORDER — TAMSULOSIN HCL 0.4 MG PO CAPS
0.4000 mg | ORAL_CAPSULE | Freq: Every day | ORAL | Status: DC
  Administered 2020-04-21 – 2020-04-23 (×3): 0.4 mg via ORAL
  Filled 2020-04-21 (×3): qty 1

## 2020-04-21 MED ORDER — METOPROLOL TARTRATE 5 MG/5ML IV SOLN
5.0000 mg | Freq: Once | INTRAVENOUS | Status: AC
  Administered 2020-04-21: 5 mg via INTRAVENOUS
  Filled 2020-04-21: qty 5

## 2020-04-21 MED ORDER — AMIODARONE HCL IN DEXTROSE 360-4.14 MG/200ML-% IV SOLN
60.0000 mg/h | INTRAVENOUS | Status: DC
  Filled 2020-04-21: qty 200

## 2020-04-21 MED ORDER — MIDODRINE HCL 5 MG PO TABS
5.0000 mg | ORAL_TABLET | Freq: Three times a day (TID) | ORAL | Status: DC
  Administered 2020-04-21 – 2020-04-23 (×6): 5 mg via ORAL
  Filled 2020-04-21 (×6): qty 1

## 2020-04-21 MED ORDER — AMIODARONE HCL IN DEXTROSE 360-4.14 MG/200ML-% IV SOLN: 30.0000 mg/h | INTRAVENOUS | Status: DC

## 2020-04-21 NOTE — Progress Notes (Signed)
  Amiodarone Drug - Drug Interaction Consult Note  Recommendations: Monitor electrolytes. Amiodarone is metabolized by the cytochrome P450 system and therefore has the potential to cause many drug interactions. Amiodarone has an average plasma half-life of 50 days (range 20 to 100 days).   There is potential for drug interactions to occur several weeks or months after stopping treatment and the onset of drug interactions may be slow after initiating amiodarone.   [x]  Statins: Increased risk of myopathy. Simvastatin- restrict dose to 20mg  daily. Other statins: counsel patients to report any muscle pain or weakness immediately.  []  Anticoagulants: Amiodarone can increase anticoagulant effect. Consider warfarin dose reduction. Patients should be monitored closely and the dose of anticoagulant altered accordingly, remembering that amiodarone levels take several weeks to stabilize.  []  Antiepileptics: Amiodarone can increase plasma concentration of phenytoin, the dose should be reduced. Note that small changes in phenytoin dose can result in large changes in levels. Monitor patient and counsel on signs of toxicity.  [x]  Beta blockers: increased risk of bradycardia, AV block and myocardial depression. Sotalol - avoid concomitant use.  []   Calcium channel blockers (diltiazem and verapamil): increased risk of bradycardia, AV block and myocardial depression.  []   Cyclosporine: Amiodarone increases levels of cyclosporine. Reduced dose of cyclosporine is recommended.  []  Digoxin dose should be halved when amiodarone is started.  [x]  Diuretics: increased risk of cardiotoxicity if hypokalemia occurs.  []  Oral hypoglycemic agents (glyburide, glipizide, glimepiride): increased risk of hypoglycemia. Patient's glucose levels should be monitored closely when initiating amiodarone therapy.   []  Drugs that prolong the QT interval:  Torsades de pointes risk may be increased with concurrent use - avoid if  possible.  Monitor QTc, also keep magnesium/potassium WNL if concurrent therapy can't be avoided. Antibiotics: e.g. fluoroquinolones, erythromycin. . Antiarrhythmics: e.g. quinidine, procainamide, disopyramide, sotalol. . Antipsychotics: e.g. phenothiazines, haloperidol.  . Lithium, tricyclic antidepressants, and methadone. Thank You,  [place]  04/21/2020 1:44 AM

## 2020-04-21 NOTE — Progress Notes (Signed)
Pt noted to convert to NSR w/HR 67. Pt is sleeping comfortably.  Will continue to monitor.

## 2020-04-21 NOTE — Progress Notes (Signed)
Patient converted back to normal sinus rhythm before hanging amiodarone drip.Observed for 15 minutes and Dr. Morton Peters made aware and order to held amiodarone  drip.Will continue to monitor the patient.

## 2020-04-21 NOTE — Discharge Instructions (Signed)
Discharge Instructions: ° °1. You may shower, please wash incisions daily with soap and water and keep dry.  If you wish to cover wounds with dressing you may do so but please keep clean and change daily.  No tub baths or swimming until incisions have completely healed.  If your incisions become red or develop any drainage please call our office at [phone] ° °2. No Driving until cleared by Van Tr[facility]'s office and you are no longer using narcotic pain medications ° °3. Monitor your weight daily.. Please use the same scale and weigh at same time... If you gain 5-10 lbs in 48 hours with associated lower extremity swelling, please contact our office at [phone] ° °4. Fever of 101.5 for at least 24 hours with no source, please contact our office at [phone] ° °5. Activity- up as tolerated, please walk at least 3 times per day.  Avoid strenuous activity, no lifting, pushing, or pulling with your arms over 8-10 lbs for a minimum of 6 weeks ° °6. If any questions or concerns arise, please do not hesitate to contact our office at [phone] °

## 2020-04-21 NOTE — Progress Notes (Signed)
CARDIAC REHAB PHASE I   Pts HR noted to be 120-130s Afib. RN made aware. Pt denies dizziness, palpitations, or SOB. Encouraged pt to hold ambulation until HR better. Pt provided with crackers, encouraged to eat. RN at bedside. Will continue to follow.  Reynold Bowen, RN BSN 04/21/2020 10:17 AM

## 2020-04-21 NOTE — Progress Notes (Signed)
Pt bladder scanned and found to have 872 in bladder after pt repeatedly attempting to empty and urinating 500.  PA notified and order received to insert Foley catheter.  Will continue to monitor.

## 2020-04-21 NOTE — Progress Notes (Signed)
CARDIAC REHAB PHASE I   Offered to walk with pt. Pt states recent return from walk with mobility tech. Pt states he feels much better since foley placement and has been able to rest some. Encouraged continued ambulation and IS use. Will continue to follow.  [phone] Reynold Bowen, RN BSN 04/21/2020 2:26 PM

## 2020-04-21 NOTE — Progress Notes (Addendum)
[address]       [place] [address]             [phone]      6 Days Post-Op Procedure(s) (LRB): Coronary artery bypass grafting times four using left internal mammary artery and endoscopically harvested right saphenous vein graft (N/A) TRANSESOPHAGEAL ECHOCARDIOGRAM (TEE) (N/A)   Subjective:  Patient complaining of having to push hard to empty his bladder.  Noticed this overnight.  Had some brief Atrial Fibrillation which converted without intervention.  Objective: Vital signs in last 24 hours: Temp:  [97.9 F (36.6 C)-98.7 F (37.1 C)] 98.3 F (36.8 C) (10/20 0340) Pulse Rate:  [63-74] 72 (10/20 0340) Cardiac Rhythm: Normal sinus rhythm (10/20 0335) Resp:  [18-21] 20 (10/20 0340) BP: (118-165)/(62-88) 165/88 (10/20 0340) SpO2:  [94 %-99 %] 95 % (10/20 0340) Weight:  [119.4 kg] 119.4 kg (10/20 0325)  Intake/Output from previous day: 10/19 0701 - 10/20 0700 In: 240 [P.O.:240] Out: 875 [Urine:875]  General appearance: alert, cooperative and no distress Heart: regular rate and rhythm Lungs: clear to auscultation bilaterally Abdomen: soft, non-tender; bowel sounds normal; no masses,  no organomegaly Extremities: edema 1+ Wound: clean and dry, ecchymosis RLE  Lab Results: Recent Labs    04/20/20 0500 04/21/20 0206  WBC 9.5 12.1*  HGB 12.0* 13.3  HCT 34.5* 38.9*  PLT 158 216   BMET:  Recent Labs    04/20/20 0500 04/21/20 0206  NA 132* 137  K 3.6 3.6  CL 98 102  CO2 26 26  GLUCOSE 148* 102*  BUN 27* 28*  CREATININE 1.19 1.35*  CALCIUM 7.8* 8.6*    PT/INR: No results for input(s): LABPROT, INR in the last 72 hours. ABG    Component Value Date/Time   PHART 7.375 04/16/2020 1004   HCO3 23.9 04/16/2020 1004   TCO2 25 04/16/2020 1004   ACIDBASEDEF 1.0 04/16/2020 1004   O2SAT 63.7 04/20/2020 0537   CBG (last 3)  Recent Labs    04/20/20 1706 04/20/20 2045 04/21/20 0616  GLUCAP 99 151* 110*    Assessment/Plan: S/P  Procedure(s) (LRB): Coronary artery bypass grafting times four using left internal mammary artery and endoscopically harvested right saphenous vein graft (N/A) TRANSESOPHAGEAL ECHOCARDIOGRAM (TEE) (N/A)  1. CV- PAF brief overnight, currently in NSR, + HTN this morning- continue Amiodarone, will decrease Midodrine to 5 mg TID.Marland Kitchen. if any further Atrial Fibrillation, may need a NOAC at discharge 2. Pulm- no acute issues, continue IS 3. Renal- creatinine remains stable, weight is below baseline, edema somewhat improved, on Lasix 40 mg daily 4. GU- difficulty voiding, denies burning, states has to force urine out, flomax ordered overnight, UA is negative for infection, but does show blood which isn't unexpected after foley placement, UC pending.. will have nursing bladder scan patient post void to ensure he is emptying bladder 5. Dispo- patient stable, brief episode of A. Fib overnight converted to NSR w/o intervention... decrease Midodrine as he is hypertensive in the 160s this morning, edema improved on lasix, Difficulty voiding, UA is clean, flomax started, UC pending, will get bladder scan to ensure patient doesn't have large amount of urinary retention present, continue current care   LOS: 6 days    Lowella Dandy, PA-C 04/21/2020  Patient needs to remain hospitalized until his atrial arrhythmias are resolved as well as his voiding difficulties and urinary retention are stabilized.  Hold oral anticoagulation pending further episodes of A. fib.  Chest x-ray satisfactory and surgical  incisions clean and dry  patient examined and medical record reviewed,agree with above note. Kathlee Nations Trigt III 04/21/2020

## 2020-04-21 NOTE — Progress Notes (Signed)
Mobility Specialist - Progress Note   04/21/20 1408  Mobility  Activity Ambulated in hall  Level of Assistance Modified independent, requires aide device or extra time  Assistive Device Front wheel walker  Distance Ambulated (ft) 200 ft  Mobility Response Tolerated well  Mobility performed by Mobility specialist  $Mobility charge 1 Mobility    Pre-mobility: 65 HR, 116/60 BP, 94% SpO2 During mobility: 80 HR Post-mobility: 65 HR, 117/73 BP, 97% SpO2  Pt asx throughout ambulation. Pt back in bed after walk.   Mamie Levers Mobility Specialist Mobility Specialist Phone: [phone]

## 2020-04-21 NOTE — Progress Notes (Signed)
Patient converted to atrial fibrillation,after using the bathroom with the rate of 120 to 150s  confirmed with 12 leads EKG.Patient also complained of difficulty passing urine and burning sensation on peeing.Dr Morton Peters made aware with orders made.Metoprolol 5 mg IV given,to start amiodarone  drip per order.V/S checked,will continue to monitor the patient.

## 2020-04-22 ENCOUNTER — Inpatient Hospital Stay (HOSPITAL_COMMUNITY): Payer: Commercial Managed Care - PPO

## 2020-04-22 DIAGNOSIS — I4891 Unspecified atrial fibrillation: Secondary | ICD-10-CM

## 2020-04-22 DIAGNOSIS — R339 Retention of urine, unspecified: Secondary | ICD-10-CM

## 2020-04-22 HISTORY — DX: Unspecified atrial fibrillation: I48.91

## 2020-04-22 HISTORY — DX: Retention of urine, unspecified: R33.9

## 2020-04-22 LAB — CBC
HCT: 33.2 % — ABNORMAL LOW (ref 39.0–52.0)
Hemoglobin: 11.5 g/dL — ABNORMAL LOW (ref 13.0–17.0)
MCH: 33.1 pg (ref 26.0–34.0)
MCHC: 34.6 g/dL (ref 30.0–36.0)
MCV: 95.7 fL (ref 80.0–100.0)
Platelets: 225 10*3/uL (ref 150–400)
RBC: 3.47 MIL/uL — ABNORMAL LOW (ref 4.22–5.81)
RDW: 12.3 % (ref 11.5–15.5)
WBC: 10.3 10*3/uL (ref 4.0–10.5)
nRBC: 0 % (ref 0.0–0.2)

## 2020-04-22 LAB — BASIC METABOLIC PANEL
Anion gap: 9 (ref 5–15)
BUN: 28 mg/dL — ABNORMAL HIGH (ref 8–23)
CO2: 26 mmol/L (ref 22–32)
Calcium: 8 mg/dL — ABNORMAL LOW (ref 8.9–10.3)
Chloride: 103 mmol/L (ref 98–111)
Creatinine, Ser: 1.31 mg/dL — ABNORMAL HIGH (ref 0.61–1.24)
GFR, Estimated: 58 mL/min — ABNORMAL LOW (ref >60)
Glucose, Bld: 102 mg/dL — ABNORMAL HIGH (ref 70–99)
Potassium: 3.9 mmol/L (ref 3.5–5.1)
Sodium: 138 mmol/L (ref 135–145)

## 2020-04-22 LAB — URINE CULTURE: Culture: <10000 — AB

## 2020-04-22 NOTE — Progress Notes (Signed)
Mobility Specialist: Progress Note   04/22/20 1429  Mobility  Activity Ambulated in hall  Level of Assistance Independent  Assistive Device None  Distance Ambulated (ft) 470 ft  Mobility Response Tolerated well  Mobility performed by Mobility specialist  Bed Position Chair  $Mobility charge 1 Mobility    Pre-Mobility: 62 HR, 112/52 BP, 93% SpO2 During Mobility: 79 HR, 95% SpO2 Post-Mobility: 67 HR, 136/63 BP, 99% SpO2  Pt ambulated in hallway independently holding heart pillow. Pt had no c/o during ambulation.   [facility] Dowdy Mobility Specialist

## 2020-04-22 NOTE — Progress Notes (Addendum)
      [address]       [place] [address]             [phone]      7 Days Post-Op Procedure(s) (LRB): Coronary artery bypass grafting times four using left internal mammary artery and endoscopically harvested right saphenous vein graft (N/A) TRANSESOPHAGEAL ECHOCARDIOGRAM (TEE) (N/A)   Subjective:  Patient is feeling better with foley in place.  He has had no further Atrial Fibrillation since lunch time yesterday.  Objective: Vital signs in last 24 hours: Temp:  [97.9 F (36.6 C)-99 F (37.2 C)] 98.6 F (37 C) (10/21 0726) Pulse Rate:  [57-109] 62 (10/21 0726) Cardiac Rhythm: Normal sinus rhythm (10/20 2002) Resp:  [18-22] 19 (10/21 0726) BP: (109-159)/(58-81) 133/65 (10/21 0726) SpO2:  [87 %-96 %] 96 % (10/21 0726) Weight:  [116.8 kg] 116.8 kg (10/21 0557)  Intake/Output from previous day: 10/20 0701 - 10/21 0700 In: 20.4 [I.V.:20.4] Out: 3050 [Urine:3050]  General appearance: alert, cooperative and no distress Heart: regular rate and rhythm Lungs: clear to auscultation bilaterally Abdomen: soft, non-tender; bowel sounds normal; no masses,  no organomegaly Extremities: extremities normal, atraumatic, no cyanosis or edema Wound: clean and dry  Lab Results: Recent Labs    04/21/20 0206 04/22/20 0133  WBC 12.1* 10.3  HGB 13.3 11.5*  HCT 38.9* 33.2*  PLT 216 225   BMET:  Recent Labs    04/21/20 0206 04/22/20 0133  NA 137 138  K 3.6 3.9  CL 102 103  CO2 26 26  GLUCOSE 102* 102*  BUN 28* 28*  CREATININE 1.35* 1.31*  CALCIUM 8.6* 8.0*    PT/INR: No results for input(s): LABPROT, INR in the last 72 hours. ABG    Component Value Date/Time   PHART 7.375 04/16/2020 1004   HCO3 23.9 04/16/2020 1004   TCO2 25 04/16/2020 1004   ACIDBASEDEF 1.0 04/16/2020 1004   O2SAT 63.7 04/20/2020 0537   CBG (last 3)  Recent Labs    04/20/20 1706 04/20/20 2045 04/21/20 0616  GLUCAP 99 151* 110*    Assessment/Plan: S/P Procedure(s)  (LRB): Coronary artery bypass grafting times four using left internal mammary artery and endoscopically harvested right saphenous vein graft (N/A) TRANSESOPHAGEAL ECHOCARDIOGRAM (TEE) (N/A)  1. CV- PAF, Sinus Bradycardia this morning- continue Coreg at 3.125 mg BID as tolerated, Amiodarone 200 mg BID, BP stable, continue Midodrine 5 mg TID for now 2. Pulm- no acute issues, off oxygen, continue IS 3. Renal- creatinine remains clinically stable, continue lasix, potassium 4. GU- urinary retention, patients symptoms much improved after foley placed, continue Flomax, outpatient urology follow up has been arranged 5. Dispo- patient stable, converted back to Sinus Bradycardia yesterday around lunchtime, continue Coreg, Amiodarone, Urinary symptoms resolved with foley catheter in place, continue Flomax... patient will be monitored today, if no further Atrial Fibrillation, will plan to d/c home tomorrow   LOS: 7 days    Lowella Dandy, PA-C 04/22/2020   patient examined and medical record reviewed,agree with above note. Kathlee Nations Trigt III 04/22/2020

## 2020-04-22 NOTE — Plan of Care (Signed)
  Problem: Health Behavior/Discharge Planning: Goal: Ability to manage health-related needs will improve Outcome: Progressing   Problem: Clinical Measurements: Goal: Respiratory complications will improve Outcome: Progressing Goal: Cardiovascular complication will be avoided Outcome: Progressing   

## 2020-04-22 NOTE — Plan of Care (Signed)
  Problem: Clinical Measurements: Goal: Will remain free from infection Outcome: Progressing Goal: Diagnostic test results will improve Outcome: Progressing   

## 2020-04-22 NOTE — Discharge Summary (Addendum)
[address]       [place] [address]             [phone]    Physician Discharge Summary  Patient ID: Dan Nelson MRN: [id number] DOB/AGE: September 28, 1955 64 y.o.  Admit date: 04/15/2020 Discharge date: 04/23/2020  Admission Diagnoses:  Patient Active Problem List   Diagnosis Date Noted   CAD (coronary artery disease) 04/07/2020   Coronary atherosclerosis 04/07/2020   Angina pectoris (HCC) 03/11/2020   Abnormal nuclear stress test 03/11/2020   Chest tightness 02/26/2020   Cardiac murmur 02/26/2020   Allergy 02/25/2020   Essential hypertension 02/25/2020   Obesity, Class I, BMI 30-34.9 10/18/2015   Drug therapy 10/18/2015   Dyslipidemia 10/18/2015   GERD (gastroesophageal reflux disease) 10/18/2015   Discharge Diagnoses:   Patient Active Problem List   Diagnosis Date Noted   Atrial fibrillation (HCC) 04/22/2020   Urinary retention with incomplete bladder emptying 04/22/2020   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 Dan Nelson is a 64 yo male with history Hypertension and Dyslipidemia.  He developed symptoms consistent with angina.  He was evaluated by [name] who sent patient for a stress test.  This was abnormal and he was subsequently referred for cardiac catheterization.  This was performed by [name] on 03/16/2020.  This showed multivessel CAD and a preserved EF.  It was felt coronary bypass grafting would be indicated and the patient was referred to [facility] for evaluation.  The patient was evaluated by [name] at which time the patient  had denied recurrent significant pain.  It was felt the patient would be a candidate for coronary bypass grafting.  The risks and benefits of the procedure were explained to the patient and he was agreeable to proceed.   Hospital Course:  [name] presented to [facility] on 04/15/2020.  He was taken to the operating room and underwent CABG x 4 utilizing LIMA to LAD, SVG to PDA, SVG to OM, and SVG to Diagonal.  He also underwent Endoscopic harvest of greater saphenous vein graft from his right leg.  He tolerated the procedure without difficulty and was taken to the SICU in stable condition.  He was extubated the evening of surgery.  During his stay in the SICU the patient was weaned off Dobutamine as tolerated.  His EPW were not functional and he they were removed without difficulty.  His chest tubes and arterial lines were removed without difficulty.  He developed an elevated creatinine level with a peak of 2.35.  He was treated with albumin and diuretics were hold.   Follow up creatinine level showed improvement of 1.40.  He developed Atrial Fibrillation and was treated with IV Amiodarone.  His potassium level was slightly low and he was supplemented accordingly.  He converted to NSR and was transferred to the progressive care unit on 04/19/2020.  The patient remained in NSR, but was bradycardic.  He was transitioned to an oral regimen of Amiodarone.  His pacing wires were removed without difficulty.  He was volume overloaded and started on IV Lasix and TED hose were placed.  The patient developed difficulty voiding.  Urinalysis was obtained and showed no evidence of infection.  There were red blood cells present which wouldn't be unexpected after a foley catheter was in place.  Post void bladder scan showed almost 900 cc of urine present.  He was started on flomax and a foley catheter was placed.   He will be discharged home with this in place and require urology follow up which has been set up with [facility] in November.  He continued to have episodes of Atrial Fibrillation.  He was treated with IV Lopressor and started on low dose Coreg.  He remained on Amiodarone 400 mg BID.  The patient's urinary symptoms resolved with placement of foley catheter.  He maintained NSR.  He is ambulating without difficulty.  His surgical incisions are healing without evidence of infection.  The patient is medically stable for discharge home today.  Significant Diagnostic Studies: angiography:   Severe multivessel coronary disease with predominantly mid to distal disease in the LAD and RCA.   Total occlusion of the dominant second obtuse marginal with collaterals from left to left.  Multiple diagonals are involved. Severe [place] 1, 1, 1 mid to distal LAD bifurcation stenosis.  Small first diagonal contains ostial and mid vessel 70% stenosis.  Larger second diagonal contains mid vessel 70% stenosis at bifurcation. RCA is dominant, contains a 60 to 65% eccentric mid vessel stenosis, followed by a large PDA which contains 95% stenosis in the distal third and supplies the apex.  There is also a very large left ventricular branch that is free of disease. Left main is widely patent. Nuclear study demonstrated infero-basal, basal-lateral, and mid inferior wall ischemia implicating RCA and circumflex ischemia.  Normal LV function.  EF is 65%.  LVEDP is normal.  Treatments: Foley placement, surgery:   1.  Coronary artery bypass grafting x4 (left internal mammary artery to left anterior descending, saphenous vein graft to diagonal, saphenous vein graft to OM, saphenous vein graft to distal posterior descending). 2.  Endoscopic harvest of right leg greater saphenous vein.  Discharge Exam: Blood pressure 138/64, pulse 67, temperature 98.2 F (36.8 C), temperature source Oral, resp. rate 20, height 6\' 1"  (1.854 m),  weight 117.5 kg, SpO2 94 %.  General appearance: alert, cooperative and no distress Heart: regular rate and rhythm Lungs: clear to auscultation bilaterally Abdomen: soft, non-tender; bowel sounds normal; no masses,  no organomegaly Extremities: edema trace Wound: C/D/I, ecchymosis of RLE   Discharge Medications:  The patient has been discharged on:   1.Beta Blocker:  Yes [ X  ]                              No   [   ]                              If No, reason:  2.Ace Inhibitor/ARB: Yes [   ]                                     No  [  X  ]  If No, reason: labile BP  3.Statin:   Yes [ X  ]                  No  [   ]                  If No, reason:  4.Ecasa:  Yes  [ X  ]                  No   [   ]                  If No, reason:    Discharge Instructions     Amb Referral to Cardiac Rehabilitation   Complete by: As directed    [place] CRP II   Diagnosis: CABG   CABG X ___: 4   After initial evaluation and assessments completed: Virtual Based Care may be provided alone or in conjunction with Phase 2 Cardiac Rehab based on patient barriers.: Yes      Allergies as of 04/23/2020       Reactions   Penicillins Swelling, Rash      Augmentin [amoxicillin-pot Clavulanate] Swelling   Crestor [rosuvastatin] Swelling   Hands swell   Zocor [simvastatin] Swelling   Hands swell        Medication List     STOP taking these medications    hydrochlorothiazide 12.5 MG tablet Commonly known as: HYDRODIURIL   isosorbide mononitrate 30 MG 24 hr tablet Commonly known as: IMDUR   lisinopril 40 MG tablet Commonly known as: ZESTRIL       TAKE these medications    amiodarone 200 MG tablet Commonly known as: PACERONE Take 1 tablet (200 mg total) by mouth 2 (two) times daily. X 7 days, then decrease to 200 mg daily   aspirin 81 MG EC tablet Take 81 mg by mouth daily.   carvedilol 3.125 MG tablet Commonly known as: COREG Take 1  tablet (3.125 mg total) by mouth 2 (two) times daily with a meal.   cholecalciferol 25 MCG (1000 UNIT) tablet Commonly known as: VITAMIN D3 Take 1,000 Units by mouth daily.   ezetimibe 10 MG tablet Commonly known as: ZETIA Take 10 mg by mouth daily.   furosemide 40 MG tablet Commonly known as: LASIX Take 1 tablet (40 mg total) by mouth daily.   nitroGLYCERIN 0.4 MG SL tablet Commonly known as: NITROSTAT Place 1 tablet (0.4 mg total) under the tongue every 5 (five) minutes as needed.   omeprazole 20 MG capsule Commonly known as: PRILOSEC Take 20 mg by mouth daily as needed (reflux).   polycarbophil 625 MG tablet Commonly known as: FIBERCON Take 625 mg by mouth daily.   potassium chloride SA 20 MEQ tablet Commonly known as: KLOR-CON Take 1 tablet (20 mEq total) by mouth daily.   rosuvastatin 20 MG tablet Commonly known as: Crestor Take 1 tablet (20 mg total) by mouth daily.   tamsulosin 0.4 MG Caps capsule Commonly known as: FLOMAX Take 1 capsule (0.4 mg total) by mouth daily.   traMADol 50 MG tablet Commonly known as: ULTRAM Take 1 tablet (50 mg total) by mouth every 4 (four) hours as needed for moderate pain.        Follow-up Information     Belva Agee, MD Follow up on 05/21/2020.   Specialty: Urology Why: Appointment is at 9:00  Contact information: [address] [place] [address] [phone]  Kerin Perna, MD Follow up on 05/12/2020.   Specialty: Cardiothoracic Surgery Why: Appointment is at 3:30, please get CXR at 3:00 at [facility] located on first floor of our office building Contact information: [address] [place] [address] [phone]         Revankar, Aundra Dubin, MD Follow up on 04/29/2020.   Specialty: Cardiology Why: Appointment is at 9:20 Contact information: [address] [place] [address] [phone]                 Signed:  Lowella Dandy, PA-C 04/23/2020,  10:29 AM  patient examined and medical record reviewed,agree with above note. Kathlee Nations Trigt III 04/27/2020

## 2020-04-22 NOTE — Progress Notes (Signed)
CARDIAC REHAB PHASE I   PRE:  Rate/Rhythm: 57 SB  BP:  Supine: 118/60  Sitting:   Standing:    SaO2: 97%RA  MODE:  Ambulation: 470 ft   POST:  Rate/Rhythm: 73 SR  BP:  Supine:   Sitting: 132/71  Standing:    SaO2: 94%RA 1015-1042 Pt walked 470 ft on RA with rolling walker independently and tolerated well. Pt does not think he will need rolling walker for home use. Remained in NSR. To recliner with call bell. Foley intact.   Luetta Nutting, RN BSN  04/22/2020 10:38 AM

## 2020-04-23 LAB — BASIC METABOLIC PANEL
Anion gap: 7 (ref 5–15)
BUN: 24 mg/dL — ABNORMAL HIGH (ref 8–23)
CO2: 28 mmol/L (ref 22–32)
Calcium: 8 mg/dL — ABNORMAL LOW (ref 8.9–10.3)
Chloride: 104 mmol/L (ref 98–111)
Creatinine, Ser: 1.3 mg/dL — ABNORMAL HIGH (ref 0.61–1.24)
GFR, Estimated: >60 mL/min (ref >60)
Glucose, Bld: 100 mg/dL — ABNORMAL HIGH (ref 70–99)
Potassium: 3.6 mmol/L (ref 3.5–5.1)
Sodium: 139 mmol/L (ref 135–145)

## 2020-04-23 LAB — CBC
HCT: 34.6 % — ABNORMAL LOW (ref 39.0–52.0)
Hemoglobin: 11.4 g/dL — ABNORMAL LOW (ref 13.0–17.0)
MCH: 31.9 pg (ref 26.0–34.0)
MCHC: 32.9 g/dL (ref 30.0–36.0)
MCV: 96.9 fL (ref 80.0–100.0)
Platelets: 261 10*3/uL (ref 150–400)
RBC: 3.57 MIL/uL — ABNORMAL LOW (ref 4.22–5.81)
RDW: 12 % (ref 11.5–15.5)
WBC: 9.7 10*3/uL (ref 4.0–10.5)
nRBC: 0 % (ref 0.0–0.2)

## 2020-04-23 MED ORDER — TRAMADOL HCL 50 MG PO TABS
50.0000 mg | ORAL_TABLET | ORAL | 0 refills | Status: DC | PRN
Start: 2020-04-23 — End: 2020-05-12

## 2020-04-23 MED ORDER — FUROSEMIDE 40 MG PO TABS: 40.0000 mg | ORAL_TABLET | Freq: Every day | ORAL | 0 refills | Status: DC

## 2020-04-23 MED ORDER — TAMSULOSIN HCL 0.4 MG PO CAPS
0.4000 mg | ORAL_CAPSULE | Freq: Every day | ORAL | 3 refills | Status: DC
Start: 2020-04-23 — End: 2023-01-11

## 2020-04-23 MED ORDER — POTASSIUM CHLORIDE CRYS ER 20 MEQ PO TBCR
20.0000 meq | EXTENDED_RELEASE_TABLET | Freq: Every day | ORAL | 0 refills | Status: DC
Start: 2020-04-23 — End: 2020-05-12

## 2020-04-23 MED ORDER — CARVEDILOL 3.125 MG PO TABS
3.1250 mg | ORAL_TABLET | Freq: Two times a day (BID) | ORAL | 3 refills | Status: DC
Start: 2020-04-23 — End: 2020-05-12

## 2020-04-23 MED ORDER — AMIODARONE HCL 200 MG PO TABS
200.0000 mg | ORAL_TABLET | Freq: Two times a day (BID) | ORAL | 1 refills | Status: DC
Start: 2020-04-23 — End: 2020-06-01

## 2020-04-23 NOTE — Progress Notes (Addendum)
      [address]       [place] [address]             [phone]      8 Days Post-Op Procedure(s) (LRB): Coronary artery bypass grafting times four using left internal mammary artery and endoscopically harvested right saphenous vein graft (N/A) TRANSESOPHAGEAL ECHOCARDIOGRAM (TEE) (N/A)   Subjective:  Patient continues to feel well.  He is ambulating without difficulty.  + BM  Objective: Vital signs in last 24 hours: Temp:  [98.2 F (36.8 C)-98.6 F (37 C)] 98.2 F (36.8 C) (10/22 0726) Pulse Rate:  [57-64] 57 (10/22 0726) Cardiac Rhythm: Sinus bradycardia (10/21 1900) Resp:  [17-24] 20 (10/22 0726) BP: (108-138)/(50-64) 138/64 (10/22 0726) SpO2:  [90 %-100 %] 94 % (10/22 0726) Weight:  [117.5 kg] 117.5 kg (10/22 0619)  Intake/Output from previous day: 10/21 0701 - 10/22 0700 In: 240 [P.O.:240] Out: 1300 [Urine:1300]  General appearance: alert, cooperative and no distress Heart: regular rate and rhythm Lungs: clear to auscultation bilaterally Abdomen: soft, non-tender; bowel sounds normal; no masses,  no organomegaly Extremities: edema trace Wound: C/D/I, ecchymosis of RLE  Lab Results: Recent Labs    04/22/20 0133 04/23/20 0042  WBC 10.3 9.7  HGB 11.5* 11.4*  HCT 33.2* 34.6*  PLT 225 261   BMET:  Recent Labs    04/22/20 0133 04/23/20 0042  NA 138 139  K 3.9 3.6  CL 103 104  CO2 26 28  GLUCOSE 102* 100*  BUN 28* 24*  CREATININE 1.31* 1.30*  CALCIUM 8.0* 8.0*    PT/INR: No results for input(s): LABPROT, INR in the last 72 hours. ABG    Component Value Date/Time   PHART 7.375 04/16/2020 1004   HCO3 23.9 04/16/2020 1004   TCO2 25 04/16/2020 1004   ACIDBASEDEF 1.0 04/16/2020 1004   O2SAT 63.7 04/20/2020 0537   CBG (last 3)  Recent Labs    04/20/20 1706 04/20/20 2045 04/21/20 0616  GLUCAP 99 151* 110*    Assessment/Plan: S/P Procedure(s) (LRB): Coronary artery bypass grafting times four using left internal mammary  artery and endoscopically harvested right saphenous vein graft (N/A) TRANSESOPHAGEAL ECHOCARDIOGRAM (TEE) (N/A)  1. CV- PAF, maintaining Sinus Bradycardia- on Coreg at 3.125, Amioarone 200 mg BID, Hypotension is improved will stop Midodrine 2. Pulm- no acute issues, off oxygen, continue IS 3. Renal- creatinine remains stable, weight is trending down, continue Lasix, potassium 4. GU- urinary retention continue Foley, flomax, outpatient urology follow up has been arranged 5. Dispo- patient stable, will stop Midodrine due to improved blood pressure, maintaining NSR on Coreg, Amiodarone, continue foley/flomax for urinary retention, will d/c home today   LOS: 8 days    Lowella Dandy, PA-C 04/23/2020  DC instructions reviewed with patient  patient examined and medical record reviewed,agree with above note. Kathlee Nations Trigt III 04/23/2020

## 2020-04-23 NOTE — TOC Transition Note (Signed)
Transition of Care (TOC) - CM/SW Discharge Note Donn Pierini RN, BSN Transitions of Care Unit 4E- RN Case Manager See Treatment Team for direct phone #    Patient Details  Name: Dan Nelson MRN: [id number] Date of Birth: 11/16/1955  Transition of Care [facility]) CM/SW Contact:  Darrold Span, RN Phone Number: 04/23/2020, 10:22 AM   Clinical Narrative:    Pt s/p CABG from home with wife, post op urinary retention to go home with foley, HHRN order placed, calls made to several [facility] agencies unable to secure for nursing needs in pt's area- [facility]- spoke with rounding PA- T. Asa Lente on 10/21 and Jacques Earthly on 10/22 regarding the [facility]- stated that pt would be f/u with urology and could be taught foley care here by bedside RN prior to discharge.     Final next level of care: Home/Self Care Barriers to Discharge: No Barriers Identified   Patient Goals and CMS Choice    unable to find HH needs in pt's area    Discharge Placement               home        Discharge Plan and Services   Discharge Planning Services: CM Consult Post Acute Care Choice: Home Health          DME Arranged: N/A DME Agency: NA       HH Arranged: RN, NA          Social Determinants of Health (SDOH) Interventions     Readmission Risk Interventions Readmission Risk Prevention Plan 04/23/2020  Transportation Screening Complete  PCP or Specialist Appt within 5-7 Days Complete  Home Care Screening Complete  Medication Review (RN CM) Complete

## 2020-04-23 NOTE — Plan of Care (Signed)
  Problem: Education: Goal: Knowledge of General Education information will improve Description: Including pain rating scale, medication(s)/side effects and non-pharmacologic comfort measures Outcome: Adequate for Discharge   

## 2020-04-23 NOTE — Progress Notes (Signed)
Chest tube sutures removed per MD order without difficulty.  Will continue to monitor. 

## 2020-04-23 NOTE — Progress Notes (Signed)
Discharge instructions (including medications) discussed with and copy provided to patient/caregiver 

## 2020-04-23 NOTE — Progress Notes (Signed)
CARDIAC REHAB PHASE I      Discussed IS use, sternal precautions, exercise guidelines, staying in the tube, restrictions, exercise guidelines and tobacco cessation. Pt was receptive and voiced that he is going to try and quit tobacco. Pt had already cut back on tobacco so he thinks he is ready to quit completely. Pt's wife is home to help take care of him. Discussed CRP II, will send referral to [facility].  [phone] Norris Cross, MS, CEP 04/23/2020

## 2020-04-27 DIAGNOSIS — M48061 Spinal stenosis, lumbar region without neurogenic claudication: Secondary | ICD-10-CM | POA: Insufficient documentation

## 2020-04-27 DIAGNOSIS — I10 Essential (primary) hypertension: Secondary | ICD-10-CM | POA: Insufficient documentation

## 2020-04-27 DIAGNOSIS — R0600 Dyspnea, unspecified: Secondary | ICD-10-CM | POA: Insufficient documentation

## 2020-04-27 DIAGNOSIS — Z789 Other specified health status: Secondary | ICD-10-CM | POA: Insufficient documentation

## 2020-04-27 DIAGNOSIS — E785 Hyperlipidemia, unspecified: Secondary | ICD-10-CM | POA: Insufficient documentation

## 2020-04-27 DIAGNOSIS — F17229 Nicotine dependence, chewing tobacco, with unspecified nicotine-induced disorders: Secondary | ICD-10-CM | POA: Insufficient documentation

## 2020-04-27 DIAGNOSIS — I2510 Atherosclerotic heart disease of native coronary artery without angina pectoris: Secondary | ICD-10-CM | POA: Insufficient documentation

## 2020-04-27 DIAGNOSIS — R748 Abnormal levels of other serum enzymes: Secondary | ICD-10-CM | POA: Insufficient documentation

## 2020-04-27 DIAGNOSIS — K219 Gastro-esophageal reflux disease without esophagitis: Secondary | ICD-10-CM | POA: Insufficient documentation

## 2020-04-27 DIAGNOSIS — E669 Obesity, unspecified: Secondary | ICD-10-CM | POA: Insufficient documentation

## 2020-04-29 ENCOUNTER — Ambulatory Visit (INDEPENDENT_AMBULATORY_CARE_PROVIDER_SITE_OTHER): Payer: Commercial Managed Care - PPO | Admitting: Cardiology

## 2020-04-29 ENCOUNTER — Encounter: Payer: Self-pay | Admitting: Cardiology

## 2020-04-29 ENCOUNTER — Other Ambulatory Visit: Payer: Self-pay

## 2020-04-29 VITALS — BP 140/68 | HR 66 | Ht 73.0 in | Wt 248.0 lb

## 2020-04-29 DIAGNOSIS — I2510 Atherosclerotic heart disease of native coronary artery without angina pectoris: Secondary | ICD-10-CM | POA: Diagnosis not present

## 2020-04-29 DIAGNOSIS — E785 Hyperlipidemia, unspecified: Secondary | ICD-10-CM

## 2020-04-29 DIAGNOSIS — E66811 Obesity, class 1: Secondary | ICD-10-CM

## 2020-04-29 DIAGNOSIS — I10 Essential (primary) hypertension: Secondary | ICD-10-CM | POA: Diagnosis not present

## 2020-04-29 DIAGNOSIS — Z951 Presence of aortocoronary bypass graft: Secondary | ICD-10-CM

## 2020-04-29 DIAGNOSIS — E669 Obesity, unspecified: Secondary | ICD-10-CM | POA: Diagnosis not present

## 2020-04-29 DIAGNOSIS — Z1329 Encounter for screening for other suspected endocrine disorder: Secondary | ICD-10-CM

## 2020-04-29 NOTE — Progress Notes (Addendum)
Cardiology Office Note:    Date:  04/29/2020   ID:  Dan Nelson, DOB 1955-08-30, MRN [id number]  PCP:  Ronal Fear, NP  Cardiologist:  Garwin Brothers, MD   Referring MD: Ronal Fear, NP    ASSESSMENT:    1. Benign essential hypertension   2. Coronary artery disease involving native coronary artery of native heart without angina pectoris   3. Essential hypertension   4. Dyslipidemia   5. Obesity, Class I, BMI 30-34.9   6. S/P CABG x 4    PLAN:    In order of problems listed above:  1. Coronary artery disease post CABG surgery: Patient is doing well and is in good spirits.  He is happy about his progress.  He is walking well after his bypass surgery and is determined to take better care of himself.  Secondary prevention stressed with the patient.  Importance of compliance with diet medication stressed any vocalized understanding. 2. Essential hypertension: Blood pressure stable his diet was emphasized 3. Mixed dyslipidemia: On statin therapy.  He will be back in 1 month for follow-up appointment.  2 to 3 days before this he will come fasting for complete blood work including lipids 4. Obesity: Weight reduction was stressed and diet was emphasized and he promises to do better.  He has follow-up appointments with his cardiac surgeon also for follow-up.  Today's visit went well and patient had multiple questions which were answered to his satisfaction.  I reviewed records of surgery and postop including echocardiogram done intraoperatively and carotid Dopplers discussed this with him at length.   Medication Adjustments/Labs and Tests Ordered: Current medicines are reviewed at length with the patient today.  Concerns regarding medicines are outlined above.  No orders of the defined types were placed in this encounter.  No orders of the defined types were placed in this encounter.    No chief complaint on file.    History of Present Illness:    Dan Nelson is a 64 y.o.  male.  Patient has past medical history of angina and abnormal stress test for which he underwent coronary angiography.  He has multivessel disease and the report is mentioned below.  He underwent CABG surgery and has done fine.  He has history of essential hypertension dyslipidemia and obesity.  He denies any problems at this time.  He is tolerated the surgery well and he walks 30 to 35 minutes without any problem.  His wound harvest sites and her wound sites are healing well.  He has developed urinary retention and has a catheter and followed by urologist for this.  Otherwise he is very happy with the outcome of his surgery.  At the time of my evaluation, the patient is alert awake oriented and in no distress.  Past Medical History:  Diagnosis Date  . Abnormal nuclear stress test 03/11/2020  . Allergy 02/25/2020  . Angina pectoris (HCC) 03/11/2020  . Atrial fibrillation (HCC) 04/22/2020  . Benign essential hypertension   . CAD (coronary artery disease) 04/07/2020  . Cardiac murmur 02/26/2020  . Chest tightness 02/26/2020  . 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 Coronary artery disease   . Coronary atherosclerosis 04/07/2020  . Drug therapy 10/18/2015  . Dyslipidemia 10/18/2015  . Dyspnea   . Essential hypertension 02/25/2020  . GERD (gastroesophageal reflux disease) 10/18/2015  . GERD without esophagitis   . Hyperlipidemia   . Liver enzyme elevation   . Obesity   . Obesity,  Class I, BMI 30-34.9 10/18/2015  . 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/15/2020  . Spinal stenosis, lumbar   . Statin intolerance   . Urinary retention with incomplete bladder emptying 04/22/2020    Past Surgical History:  Procedure Laterality Date  . CARDIAC CATHETERIZATION    . CORONARY ARTERY BYPASS GRAFT N/A 04/15/2020   Procedure: Coronary artery bypass grafting times four using left internal mammary artery and endoscopically harvested right saphenous vein graft;  Surgeon: Kerin Perna, MD;  Location:  [facility] OR;  Service: Open Heart Surgery;  Laterality: N/A;  . Cosmetic ear surgery bilateral, childhood    . LEFT HEART CATH AND CORONARY ANGIOGRAPHY N/A 03/16/2020   Procedure: LEFT HEART CATH AND CORONARY ANGIOGRAPHY;  Surgeon: Lyn Records, MD;  Location: [facility] INVASIVE [facility] LAB;  Service: Cardiovascular;  Laterality: N/A;  . TEE WITHOUT CARDIOVERSION N/A 04/15/2020   Procedure: TRANSESOPHAGEAL ECHOCARDIOGRAM (TEE);  Surgeon: Donata Clay, Theron Arista, MD;  Location: [facility] OR;  Service: Open Heart Surgery;  Laterality: N/A;  . 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 amiodarone (PACERONE) 200 MG tablet Take 1 tablet (200 mg total) by mouth 2 (two) times daily. X 7 days, then decrease to 200 mg daily  . aspirin 81 MG EC tablet Take 81 mg by mouth daily.  . carvedilol (COREG) 3.125 MG tablet Take 1 tablet (3.125 mg total) by mouth 2 (two) times daily with a meal.  . cholecalciferol (VITAMIN D3) 25 MCG (1000 UNIT) tablet Take 1,000 Units by mouth daily.  Marland Kitchen ezetimibe (ZETIA) 10 MG tablet Take 10 mg by mouth daily.  . furosemide (LASIX) 40 MG tablet Take 1 tablet (40 mg total) by mouth daily.  . nitroGLYCERIN (NITROSTAT) 0.4 MG SL tablet Place 1 tablet (0.4 mg total) under the tongue every 5 (five) minutes as needed.  Marland Kitchen omeprazole (PRILOSEC) 20 MG capsule Take 20 mg by mouth daily as needed (reflux).   . polycarbophil (FIBERCON) 625 MG tablet Take 625 mg by mouth daily.  . potassium chloride SA (KLOR-CON) 20 MEQ tablet Take 1 tablet (20 mEq total) by mouth daily.  . rosuvastatin (CRESTOR) 20 MG tablet Take 1 tablet (20 mg total) by mouth daily.  . tamsulosin (FLOMAX) 0.4 MG CAPS capsule Take 1 capsule (0.4 mg total) by mouth daily.  . traMADol (ULTRAM) 50 MG tablet Take 1 tablet (50 mg total) by mouth every 4 (four) hours as needed for moderate pain.     Allergies:   Penicillins, Augmentin [amoxicillin-pot clavulanate], Crestor [rosuvastatin],  and Zocor [simvastatin]   Social History   Socioeconomic History  . Marital status: Married    Spouse name: Not on file  . Number of children: Not on file  . Years of education: Not on file  . Highest education level: Not on file  Occupational History  . Not on file  Tobacco Use  . Smoking status: Current Every Day Smoker    Types: Cigarettes  . Smokeless tobacco: Current User    Types: Snuff, Chew  Substance and Sexual Activity  . Alcohol use: Never  . Drug use: Not on file  . Sexual activity: Not on file  Other Topics Concern  . Not on file  Social History Narrative  . Not on file   Social Determinants of Health   Financial Resource Strain:   . Difficulty of Paying Living Expenses: Not on file  Food Insecurity:   . Worried About  Running Out of Food in the Last Year: Not on file  . Ran Out of Food in the Last Year: Not on file  Transportation Needs:   . Lack of Transportation (Medical): Not on file  . Lack of Transportation (Non-Medical): Not on file  Physical Activity:   . Days of Exercise per Week: Not on file  . Minutes of Exercise per Session: Not on file  Stress:   . Feeling of Stress : Not on file  Social Connections:   . Frequency of Communication with Friends and Family: Not on file  . Frequency of Social Gatherings with Friends and Family: Not on file  . Attends Religious Services: Not on file  . Active Member of Clubs or Organizations: Not on file  . Attends Banker Meetings: Not on file  . Marital Status: Not on file     Family History: The patient's family history includes CAD in his father and mother; Heart attack in his father and mother; Hypercholesterolemia in his brother and mother; Stroke in his father.  ROS:   Please see the history of present illness.    All other systems reviewed and are negative.  EKGs/Labs/Other Studies Reviewed:    The following studies were reviewed today:  EKG reveals sinus rhythm and nonspecific  ST-T changes.   Lyn Records, MD (Primary)    Procedures  LEFT HEART CATH AND CORONARY ANGIOGRAPHY  Conclusion   Severe multivessel coronary disease with predominantly mid to distal disease in the LAD and RCA.    Total occlusion of the dominant second obtuse marginal with collaterals from left to left.  Multiple diagonals are involved.  Severe [place] 1, 1, 1 mid to distal LAD bifurcation stenosis.  Small first diagonal contains ostial and mid vessel 70% stenosis.  Larger second diagonal contains mid vessel 70% stenosis at bifurcation.  RCA is dominant, contains a 60 to 65% eccentric mid vessel stenosis, followed by a large PDA which contains 95% stenosis in the distal third and supplies the apex.  There is also a very large left ventricular branch that is free of disease.  Left main is widely patent.  Nuclear study demonstrated infero-basal, basal-lateral, and mid inferior wall ischemia implicating RCA and circumflex ischemia.   Normal LV function.  EF is 65%.  LVEDP is normal.  RECOMMENDATIONS:   TCTS consult to determine if the patient is a surgical candidate.  The obtuse marginal, third diagonal, and distal right coronary territories are graftable.  Nuclear study performed prior to admission suggesting old basal and mid inferior wall ischemia suggesting mid RCA is hemodynamically significant.  Start Imdur and statin therapy.    Recent Labs: 03/02/2020: TSH 1.080 04/13/2020: ALT 47 04/18/2020: Magnesium 2.6 04/23/2020: BUN 24; Creatinine, Ser 1.30; Hemoglobin 11.4; Platelets 261; Potassium 3.6; Sodium 139  Recent Lipid Panel    Component Value Date/Time   CHOL 160 03/02/2020 1002   TRIG 115 03/02/2020 1002   HDL 36 (L) 03/02/2020 1002   CHOLHDL 4.4 03/02/2020 1002   LDLCALC 103 (H) 03/02/2020 1002    Physical Exam:    VS:  BP 140/68   Pulse 66   Ht 6\' 1"  (1.854 m)   Wt 248 lb (112.5 kg)   SpO2 95%   BMI 32.72 kg/m     Wt Readings from Last 3  Encounters:  04/29/20 248 lb (112.5 kg)  04/23/20 259 lb 1.6 oz (117.5 kg)  04/13/20 260 lb 3.2 oz (118 kg)     GEN:  Patient is in no acute distress HEENT: Normal NECK: No JVD; No carotid bruits LYMPHATICS: No lymphadenopathy CARDIAC: Hear sounds regular, 2/6 systolic murmur at the apex. RESPIRATORY:  Clear to auscultation without rales, wheezing or rhonchi  ABDOMEN: Soft, non-tender, non-distended MUSCULOSKELETAL:  No edema; No deformity  SKIN: Warm and dry NEUROLOGIC:  Alert and oriented x 3 PSYCHIATRIC:  Normal affect   Signed, Garwin Brothersajan R Priyah Schmuck, MD  04/29/2020 9:44 AM    [facility]

## 2020-04-29 NOTE — Patient Instructions (Signed)
Medication Instructions:  No medication changes. *If you need a refill on your cardiac medications before your next appointment, please call your pharmacy*   Lab Work: Your physician recommends that you return for lab work in: before your next visit. You need to have labs done when you are fasting.  You can come Monday through Friday 8:30 am to 12:00 pm and 1:15 to 4:30. You do not need to make an appointment as the order has already been placed. The labs you are going to have done are BMET, CBC, TSH, LFT and Lipids.   If you have labs (blood work) drawn today and your tests are completely normal, you will receive your results only by: Marland Kitchen MyChart Message (if you have MyChart) OR . A paper copy in the mail If you have any lab test that is abnormal or we need to change your treatment, we will call you to review the results.   Testing/Procedures: None ordered   Follow-Up: At [facility], you and your health needs are our priority.  As part of our continuing mission to provide you with exceptional heart care, we have created designated Provider Care Teams.  These Care Teams include your primary Cardiologist (physician) and Advanced Practice Providers (APPs -  Physician Assistants and Nurse Practitioners) who all work together to provide you with the care you need, when you need it.  We recommend signing up for the patient portal called "MyChart".  Sign up information is provided on this After Visit Summary.  MyChart is used to connect with patients for Virtual Visits (Telemedicine).  Patients are able to view lab/test results, encounter notes, upcoming appointments, etc.  Non-urgent messages can be sent to your provider as well.   To learn more about what you can do with MyChart, go to [email or web address].    Your next appointment:   1 month(s)  The format for your next appointment:   In Person  Provider:   Belva Crome, MD   Other Instructions NA

## 2020-05-03 ENCOUNTER — Telehealth: Payer: Self-pay | Admitting: Cardiology

## 2020-05-03 ENCOUNTER — Telehealth: Payer: Self-pay

## 2020-05-03 NOTE — Telephone Encounter (Signed)
Pt c/o medication issue:  1. Name of Medication: carvedilol (COREG) 3.125 MG tablet  2. How are you currently taking this medication (dosage and times per day)? As directed. Took one tablet this morning but wont take the 2nd one until dinner time.   3. Are you having a reaction (difficulty breathing--STAT)? yes  4. What is your medication issue? Patient states that he was put on this medication by Dr. Raford Pitcher after his 10/14 open heart surgery. He says that it is giving him bad thoughts and makes him feel like he doesn't matter. He spoke to Dr. Zenaida Niece Trigt's office who told him to contact his PCP doctor about this issue. His PCP told him to reach out to Dr. Tomie China and see what he thinks he should do. Please advise.

## 2020-05-03 NOTE — Telephone Encounter (Signed)
Patient contacted the office requesting more information about Coreg and depression.  He thought that maybe his new medication could be causing his decline in mental state.  He is s/p CABG 04/15/20 with [name].  He stated that he gets to where he is depressed at the end of the day, and all he wanted to do was go in the bed and stare at the ceiling.  He also stated that he chewed tobacco for almost 50 years and maybe he thought that may be the problem.  Advised patient to contact his PCP, Dan Guile, NP to get an appointment for evaluation.  Advised that sometimes after surgery patient's can feel depressed, but that he should see his PCP about it.  He acknowledged receipt and advised that he would contact our office back with an update.

## 2020-05-04 NOTE — Telephone Encounter (Signed)
Let him stop it for a week or 2 and let us know how he feels.  Keep a track of pulse blood pressure and let us know in a week so we can adjust medications.

## 2020-05-04 NOTE — Telephone Encounter (Signed)
Recommendation as per Dr. Tomie China was given to the pt. Pt verbalized understanding and had no additional question.

## 2020-05-11 ENCOUNTER — Other Ambulatory Visit: Payer: Self-pay | Admitting: Cardiothoracic Surgery

## 2020-05-11 DIAGNOSIS — Z951 Presence of aortocoronary bypass graft: Secondary | ICD-10-CM

## 2020-05-12 ENCOUNTER — Other Ambulatory Visit: Payer: Self-pay

## 2020-05-12 ENCOUNTER — Ambulatory Visit
Admission: RE | Admit: 2020-05-12 | Discharge: 2020-05-12 | Disposition: A | Discharge status: Home / Self Care | Payer: Commercial Managed Care - PPO | Source: Ambulatory Visit | Attending: Cardiothoracic Surgery | Admitting: Cardiothoracic Surgery

## 2020-05-12 ENCOUNTER — Ambulatory Visit (INDEPENDENT_AMBULATORY_CARE_PROVIDER_SITE_OTHER): Payer: Self-pay | Admitting: Cardiothoracic Surgery

## 2020-05-12 ENCOUNTER — Encounter: Payer: Self-pay | Admitting: Cardiothoracic Surgery

## 2020-05-12 ENCOUNTER — Ambulatory Visit: Payer: Commercial Managed Care - PPO | Admitting: Cardiothoracic Surgery

## 2020-05-12 VITALS — BP 150/90 | HR 76 | Temp 98.1°F | Resp 20 | Ht 73.0 in | Wt 245.0 lb

## 2020-05-12 DIAGNOSIS — Z09 Encounter for follow-up examination after completed treatment for conditions other than malignant neoplasm: Secondary | ICD-10-CM

## 2020-05-12 DIAGNOSIS — Z951 Presence of aortocoronary bypass graft: Secondary | ICD-10-CM

## 2020-05-12 DIAGNOSIS — I25119 Atherosclerotic heart disease of native coronary artery with unspecified angina pectoris: Secondary | ICD-10-CM

## 2020-05-12 HISTORY — DX: Encounter for follow-up examination after completed treatment for conditions other than malignant neoplasm: Z09

## 2020-05-12 MED ORDER — CARVEDILOL 6.25 MG PO TABS
6.2500 mg | ORAL_TABLET | Freq: Two times a day (BID) | ORAL | 4 refills | Status: DC
Start: 2020-05-12 — End: 2020-05-12

## 2020-05-12 NOTE — Progress Notes (Signed)
PCP is Ronal Fear, NP Referring Provider is Lyn Records, MD  Chief Complaint  Patient presents with  . Routine Post Op    f/u from surgery with CXR s/p CABG    HPI: Patient presents for first postop visit after urgent CABG x4 after presenting with unstable angina and severe three-vessel CAD.  He did well after surgery but had some transient atrial fibrillation. The patient is now doing well at home without recurrent angina.  Surgical incisions are healing well.  He is anxious to increase his activity level and to start driving.  He plans his resume working after he recovers.  Past Medical History:  Diagnosis Date  . Abnormal nuclear stress test 03/11/2020  . Allergy 02/25/2020  . Angina pectoris (HCC) 03/11/2020  . Atrial fibrillation (HCC) 04/22/2020  . Benign essential hypertension   . CAD (coronary artery disease) 04/07/2020  . Cardiac murmur 02/26/2020  . Chest tightness 02/26/2020  . 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 Coronary artery disease   . Coronary atherosclerosis 04/07/2020  . Drug therapy 10/18/2015  . Dyslipidemia 10/18/2015  . Dyspnea   . Essential hypertension 02/25/2020  . GERD (gastroesophageal reflux disease) 10/18/2015  . GERD without esophagitis   . Hyperlipidemia   . Liver enzyme elevation   . Obesity   . Obesity, Class I, BMI 30-34.9 10/18/2015  . 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/15/2020  . Spinal stenosis, lumbar   . Statin intolerance   . Urinary retention with incomplete bladder emptying 04/22/2020    Past Surgical History:  Procedure Laterality Date  . CARDIAC CATHETERIZATION    . CORONARY ARTERY BYPASS GRAFT N/A 04/15/2020   Procedure: Coronary artery bypass grafting times four using left internal mammary artery and endoscopically harvested right saphenous vein graft;  Surgeon: Kerin Perna, MD;  Location: [facility] OR;  Service: Open Heart Surgery;  Laterality: N/A;  . Cosmetic ear surgery bilateral, childhood    . LEFT HEART CATH AND  CORONARY ANGIOGRAPHY N/A 03/16/2020   Procedure: LEFT HEART CATH AND CORONARY ANGIOGRAPHY;  Surgeon: Lyn Records, MD;  Location: [facility] INVASIVE CV LAB;  Service: Cardiovascular;  Laterality: N/A;  . TEE WITHOUT CARDIOVERSION N/A 04/15/2020   Procedure: TRANSESOPHAGEAL ECHOCARDIOGRAM (TEE);  Surgeon: Donata Clay, Theron Arista, MD;  Location: [facility] OR;  Service: Open Heart Surgery;  Laterality: N/A;  . 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 Hypercholesterolemia Mother   . CAD Mother   . Heart attack Mother   . CAD Father   . Heart attack Father   . Stroke Father   . Hypercholesterolemia Brother     Social History Social History   Tobacco Use  . Smoking status: Current Every Day Smoker    Types: Cigarettes  . Smokeless tobacco: Current User    Types: Snuff, Chew  Substance Use Topics  . Alcohol use: Never  . Drug use: Not on file    Current Outpatient Medications  Medication Sig Dispense Refill  . amiodarone (PACERONE) 200 MG tablet Take 1 tablet (200 mg total) by mouth 2 (two) times daily. X 7 days, then decrease to 200 mg daily 60 tablet 1  . aspirin 81 MG EC tablet Take 81 mg by mouth daily.    . cholecalciferol (VITAMIN D3) 25 MCG (1000 UNIT) tablet Take 1,000 Units by mouth daily.    Marland Kitchen ezetimibe (ZETIA) 10 MG tablet Take 10 mg  by mouth daily.    . nitroGLYCERIN (NITROSTAT) 0.4 MG SL tablet Place 1 tablet (0.4 mg total) under the tongue every 5 (five) minutes as needed. 25 tablet 6  . omeprazole (PRILOSEC) 20 MG capsule Take 20 mg by mouth daily as needed (reflux).     . polycarbophil (FIBERCON) 625 MG tablet Take 625 mg by mouth daily.    . rosuvastatin (CRESTOR) 20 MG tablet Take 1 tablet (20 mg total) by mouth daily. 30 tablet 11  . tamsulosin (FLOMAX) 0.4 MG CAPS capsule Take 1 capsule (0.4 mg total) by mouth daily. 30 capsule 3  . carvedilol (COREG) 6.25 MG tablet Take 1 tablet (6.25 mg total) by mouth 2 (two) times  daily with a meal. 60 tablet 4  . furosemide (LASIX) 40 MG tablet Take 1 tablet (40 mg total) by mouth daily. (Patient not taking: Reported on 05/12/2020) 7 tablet 0   No current facility-administered medications for this visit.    Allergies  Allergen Reactions  . Penicillins Swelling and Rash       . Augmentin [Amoxicillin-Pot Clavulanate] Swelling  . Crestor [Rosuvastatin] Swelling    Hands swell  . Zocor [Simvastatin] Swelling    Hands swell    Review of Systems  Complaints of difficulty sleeping \\Complaints  of numbness in the fourth and fifth finger of the right hand No significant incisional pain Postoperative edema has resolved Complains of terrible nightmares and feeling of malaise associated with his Coreg which was stopped by his cardiologist  BP (!) 150/90   Pulse 76   Temp 98.1 F (36.7 C) (Skin)   Resp 20   Ht 6\' 1"  (1.854 m)   Wt 245 lb (111.1 kg)   SpO2 95% Comment: RA  BMI 32.32 kg/m  Physical Exam      Exam    General- alert and comfortable.  Sternal incision well-healed.  Leg incision well-healed.    Neck- no JVD, no cervical adenopathy palpable, no carotid bruit   Lungs- clear without rales, wheezes   Cor- regular rate and rhythm, no murmur , gallop   Abdomen- soft, non-tender   Extremities - warm, non-tender, minimal edema   Neuro- oriented, appropriate, no focal weakness   Diagnostic Tests: Chest x-ray today personally viewed clear.  No pleural effusion.  Sternal wires intact  Impression: Doing well 1 month after urgent CABG x4. The patient will finish his course of amiodarone  and then stop.  He has maintained sinus rhythm. He will continue his other medications but hold off on the carvedilol until his cardiologist assesses his blood pressure, he had severe untoward side effects of the carvedilol. Plan: The patient may drive.  The patient may lift up to 10 pounds.  The patient is not ready to return to work at the probably until  early 2022.  I will see the patient back in early 2022 to assess his recovery and to discuss return to work.   2023, MD [facility] [phone]

## 2020-05-14 ENCOUNTER — Other Ambulatory Visit: Payer: Self-pay

## 2020-05-14 DIAGNOSIS — I10 Essential (primary) hypertension: Secondary | ICD-10-CM

## 2020-05-14 MED ORDER — LOSARTAN POTASSIUM 50 MG PO TABS: 50.0000 mg | ORAL_TABLET | Freq: Every day | ORAL | 3 refills | Status: DC

## 2020-05-17 ENCOUNTER — Telehealth (HOSPITAL_COMMUNITY): Payer: Self-pay

## 2020-05-17 NOTE — Telephone Encounter (Signed)
Faxed cardiac rehab referral to [facility], per phase I. 

## 2020-05-18 DIAGNOSIS — Z736 Limitation of activities due to disability: Secondary | ICD-10-CM

## 2020-05-22 LAB — BASIC METABOLIC PANEL
BUN/Creatinine Ratio: 12 (ref 10–24)
BUN: 13 mg/dL (ref 8–27)
CO2: 25 mmol/L (ref 20–29)
Calcium: 9.5 mg/dL (ref 8.6–10.2)
Chloride: 104 mmol/L (ref 96–106)
Creatinine, Ser: 1.07 mg/dL (ref 0.76–1.27)
GFR calc Af Amer: 86 mL/min/{1.73_m2} (ref >59)
GFR calc non Af Amer: 74 mL/min/{1.73_m2} (ref >59)
Glucose: 93 mg/dL (ref 65–99)
Potassium: 4.4 mmol/L (ref 3.5–5.2)
Sodium: 142 mmol/L (ref 134–144)

## 2020-05-22 LAB — CBC WITH DIFFERENTIAL/PLATELET
Basophils Absolute: 0.1 10*3/uL (ref 0.0–0.2)
Basos: 1 %
EOS (ABSOLUTE): 0.3 10*3/uL (ref 0.0–0.4)
Eos: 4 %
Hematocrit: 40.5 % (ref 37.5–51.0)
Hemoglobin: 14.1 g/dL (ref 13.0–17.7)
Immature Grans (Abs): 0 10*3/uL (ref 0.0–0.1)
Immature Granulocytes: 0 %
Lymphocytes Absolute: 1.8 10*3/uL (ref 0.7–3.1)
Lymphs: 19 %
MCH: 33.3 pg — ABNORMAL HIGH (ref 26.6–33.0)
MCHC: 34.8 g/dL (ref 31.5–35.7)
MCV: 96 fL (ref 79–97)
Monocytes Absolute: 0.7 10*3/uL (ref 0.1–0.9)
Monocytes: 8 %
Neutrophils Absolute: 6.5 10*3/uL (ref 1.4–7.0)
Neutrophils: 68 %
Platelets: 276 10*3/uL (ref 150–450)
RBC: 4.23 x10E6/uL (ref 4.14–5.80)
RDW: 12.7 % (ref 11.6–15.4)
WBC: 9.5 10*3/uL (ref 3.4–10.8)

## 2020-05-22 LAB — HEPATIC FUNCTION PANEL
ALT: 26 IU/L (ref 0–44)
AST: 23 IU/L (ref 0–40)
Albumin: 4.2 g/dL (ref 3.8–4.8)
Alkaline Phosphatase: 79 IU/L (ref 44–121)
Bilirubin Total: 0.4 mg/dL (ref 0.0–1.2)
Bilirubin, Direct: 0.12 mg/dL (ref 0.00–0.40)
Total Protein: 6.9 g/dL (ref 6.0–8.5)

## 2020-05-22 LAB — TSH: TSH: 2.78 u[IU]/mL (ref 0.450–4.500)

## 2020-06-01 ENCOUNTER — Other Ambulatory Visit: Payer: Self-pay

## 2020-06-01 ENCOUNTER — Ambulatory Visit (INDEPENDENT_AMBULATORY_CARE_PROVIDER_SITE_OTHER): Payer: Commercial Managed Care - PPO | Admitting: Cardiology

## 2020-06-01 ENCOUNTER — Encounter: Payer: Self-pay | Admitting: Cardiology

## 2020-06-01 ENCOUNTER — Telehealth: Payer: Self-pay

## 2020-06-01 VITALS — BP 134/82 | HR 70 | Ht 73.0 in | Wt 251.6 lb

## 2020-06-01 DIAGNOSIS — E785 Hyperlipidemia, unspecified: Secondary | ICD-10-CM

## 2020-06-01 DIAGNOSIS — E669 Obesity, unspecified: Secondary | ICD-10-CM | POA: Diagnosis not present

## 2020-06-01 DIAGNOSIS — Z951 Presence of aortocoronary bypass graft: Secondary | ICD-10-CM

## 2020-06-01 DIAGNOSIS — I2510 Atherosclerotic heart disease of native coronary artery without angina pectoris: Secondary | ICD-10-CM

## 2020-06-01 DIAGNOSIS — I10 Essential (primary) hypertension: Secondary | ICD-10-CM | POA: Diagnosis not present

## 2020-06-01 MED ORDER — LOSARTAN POTASSIUM 100 MG PO TABS
100.0000 mg | ORAL_TABLET | Freq: Every day | ORAL | 3 refills | Status: DC
Start: 2020-06-01 — End: 2020-06-01

## 2020-06-01 MED ORDER — LOSARTAN POTASSIUM 100 MG PO TABS
100.0000 mg | ORAL_TABLET | Freq: Every day | ORAL | 1 refills | Status: DC
Start: 2020-06-01 — End: 2021-06-09

## 2020-06-01 NOTE — Progress Notes (Signed)
Cardiology Office Note:    Date:  06/01/2020   ID:  Dan Nelson, DOB Nov 27, 1955, MRN [id number]  PCP:  Ronal Fear, NP  Cardiologist:  Garwin Brothers, MD   Referring MD: Ronal Fear, NP    ASSESSMENT:    1. Coronary artery disease involving native coronary artery of native heart without angina pectoris   2. Essential hypertension   3. Benign essential hypertension   4. Obesity, Class I, BMI 30-34.9   5. Dyslipidemia   6. S/P CABG x 4    PLAN:    In order of problems listed above:  1. Coronary artery disease post CABG surgery: Secondary prevention stressed with the patient.  Importance of compliance with diet medication stressed any vocalized understanding.  He was advised to walk at least half an hour a day 5 days a week and he promises to do so. 2. Essential hypertension: Blood pressure stable and diet was emphasized.  We will refill his blood pressure medication today.  Lab work was reviewed. 3. Mixed dyslipidemia: Diet was emphasized.  KPN sheet was reviewed.  His blood work was recently done by primary care physician. 4. Obesity: Weight reduction was stressed and he promises to do better.  Diet was emphasized extensively. 5. Patient will be seen in follow-up appointment in 4 months or earlier if the patient has any concerns    Medication Adjustments/Labs and Tests Ordered: Current medicines are reviewed at length with the patient today.  Concerns regarding medicines are outlined above.  No orders of the defined types were placed in this encounter.  No orders of the defined types were placed in this encounter.    No chief complaint on file.    History of Present Illness:    Dan Nelson is a 64 y.o. male.  Patient has past medical history of coronary artery disease post CABG surgery, essential hypertension and dyslipidemia.  He denies any problems at this time and takes care of activities of daily living.  He is walking on a regular basis.  He is going to start  cardiac rehab next week.  No chest pain orthopnea or PND.  At the time of my evaluation, the patient is alert awake oriented and in no distress.  Past Medical History:  Diagnosis Date  . Abnormal nuclear stress test 03/11/2020  . Allergy 02/25/2020  . Angina pectoris (HCC) 03/11/2020  . Atrial fibrillation (HCC) 04/22/2020  . Benign essential hypertension   . CAD (coronary artery disease) 04/07/2020  . Cardiac murmur 02/26/2020  . Chest tightness 02/26/2020  . 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 Coronary artery disease   . Coronary atherosclerosis 04/07/2020  . Drug therapy 10/18/2015  . Dyslipidemia 10/18/2015  . Dyspnea   . Essential hypertension 02/25/2020  . GERD (gastroesophageal reflux disease) 10/18/2015  . GERD without esophagitis   . Hyperlipidemia   . Liver enzyme elevation   . Obesity   . Obesity, Class I, BMI 30-34.9 10/18/2015  . Postop check 05/12/2020  . 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/15/2020  . Spinal stenosis, lumbar   . Statin intolerance   . Urinary retention with incomplete bladder emptying 04/22/2020    Past Surgical History:  Procedure Laterality Date  . CARDIAC CATHETERIZATION    . CORONARY ARTERY BYPASS GRAFT N/A 04/15/2020   Procedure: Coronary artery bypass grafting times four using left internal mammary artery and endoscopically harvested right saphenous vein graft;  Surgeon: Kerin Perna, MD;  Location: [facility] OR;  Service: Open Heart Surgery;  Laterality: N/A;  . Cosmetic ear surgery bilateral, childhood    . LEFT HEART CATH AND CORONARY ANGIOGRAPHY N/A 03/16/2020   Procedure: LEFT HEART CATH AND CORONARY ANGIOGRAPHY;  Surgeon: Lyn Records, MD;  Location: [facility] INVASIVE CV LAB;  Service: Cardiovascular;  Laterality: N/A;  . TEE WITHOUT CARDIOVERSION N/A 04/15/2020   Procedure: TRANSESOPHAGEAL ECHOCARDIOGRAM (TEE);  Surgeon: Donata Clay, Theron Arista, MD;  Location: [facility] OR;  Service: Open Heart Surgery;  Laterality: N/A;  . traumatic amputation left  3rd finger distal phalanx      Current Medications: Current Meds  Medication Sig  . aspirin 81 MG EC tablet Take 81 mg by mouth daily.  . cholecalciferol (VITAMIN D3) 25 MCG (1000 UNIT) tablet Take 1,000 Units by mouth daily.  Marland Kitchen ezetimibe (ZETIA) 10 MG tablet Take 10 mg by mouth daily.  Marland Kitchen losartan (COZAAR) 100 MG tablet Take 100 mg by mouth daily.  . polycarbophil (FIBERCON) 625 MG tablet Take 625 mg by mouth daily.  . rosuvastatin (CRESTOR) 20 MG tablet Take 1 tablet (20 mg total) by mouth daily.  . tamsulosin (FLOMAX) 0.4 MG CAPS capsule Take 1 capsule (0.4 mg total) by mouth daily.     Allergies:   Penicillins, Augmentin [amoxicillin-pot clavulanate], and Zocor [simvastatin]   Social History   Socioeconomic History  . Marital status: Married    Spouse name: Not on file  . Number of children: Not on file  . Years of education: Not on file  . Highest education level: Not on file  Occupational History  . Not on file  Tobacco Use  . Smoking status: Current Every Day Smoker    Types: Cigarettes  . Smokeless tobacco: Current User    Types: Snuff, Chew  Substance and Sexual Activity  . Alcohol use: Never  . Drug use: Not on file  . Sexual activity: Not on file  Other Topics Concern  . Not on file  Social History Narrative  . Not on file   Social Determinants of Health   Financial Resource Strain:   . Difficulty of Paying Living Expenses: Not on file  Food Insecurity:   . Worried About Programme researcher, broadcasting/film/video in the Last Year: Not on file  . Ran Out of Food in the Last Year: Not on file  Transportation Needs:   . Lack of Transportation (Medical): Not on file  . Lack of Transportation (Non-Medical): Not on file  Physical Activity:   . Days of Exercise per Week: Not on file  . Minutes of Exercise per Session: Not on file  Stress:   . Feeling of Stress : Not on file  Social Connections:   . Frequency of Communication with Friends and Family: Not on file  . Frequency of  Social Gatherings with Friends and Family: Not on file  . Attends Religious Services: Not on file  . Active Member of Clubs or Organizations: Not on file  . Attends Banker Meetings: Not on file  . Marital Status: Not on file     Family History: The patient's family history includes CAD in his father and mother; Heart attack in his father and mother; Hypercholesterolemia in his brother and mother; Stroke in his father.  ROS:   Please see the history of present illness.    All other systems reviewed and are negative.  EKGs/Labs/Other Studies Reviewed:    The following studies were reviewed today: I discussed findings with the patient at length and reviewed  coronary angiography report.   Recent Labs: 04/18/2020: Magnesium 2.6 05/21/2020: ALT 26; BUN 13; Creatinine, Ser 1.07; Hemoglobin 14.1; Platelets 276; Potassium 4.4; Sodium 142; TSH 2.780  Recent Lipid Panel    Component Value Date/Time   CHOL 160 03/02/2020 1002   TRIG 115 03/02/2020 1002   HDL 36 (L) 03/02/2020 1002   CHOLHDL 4.4 03/02/2020 1002   LDLCALC 103 (H) 03/02/2020 1002    Physical Exam:    VS:  BP 134/82   Pulse 70   Ht 6\' 1"  (1.854 m)   Wt 251 lb 9.6 oz (114.1 kg)   SpO2 97%   BMI 33.19 kg/m     Wt Readings from Last 3 Encounters:  06/01/20 251 lb 9.6 oz (114.1 kg)  05/12/20 245 lb (111.1 kg)  04/29/20 248 lb (112.5 kg)     GEN: Patient is in no acute distress HEENT: Normal NECK: No JVD; No carotid bruits LYMPHATICS: No lymphadenopathy CARDIAC: Hear sounds regular, 2/6 systolic murmur at the apex. RESPIRATORY:  Clear to auscultation without rales, wheezing or rhonchi  ABDOMEN: Soft, non-tender, non-distended MUSCULOSKELETAL:  No edema; No deformity  SKIN: Warm and dry NEUROLOGIC:  Alert and oriented x 3 PSYCHIATRIC:  Normal affect   Signed, 05/01/20, MD  06/01/2020 10:22 AM    [facility]

## 2020-06-01 NOTE — Telephone Encounter (Signed)
Patient given bottle of Aspirin 81 mg in office. Lot #:[id number] Expiration: 01-23 °

## 2020-06-01 NOTE — Patient Instructions (Signed)

## 2020-06-01 NOTE — Addendum Note (Signed)
Addended by: Eleonore Chiquito on: 06/01/2020 10:47 AM   Modules accepted: Orders

## 2020-06-07 DIAGNOSIS — I10 Essential (primary) hypertension: Secondary | ICD-10-CM

## 2020-06-09 MED ORDER — HYDROCHLOROTHIAZIDE 25 MG PO TABS: 12.5000 mg | ORAL_TABLET | Freq: Every day | ORAL | 3 refills | Status: DC

## 2020-06-09 NOTE — Addendum Note (Signed)
Addended by: Eleonore Chiquito on: 06/09/2020 08:28 AM   Modules accepted: Orders

## 2020-06-18 LAB — BASIC METABOLIC PANEL
BUN/Creatinine Ratio: 13 (ref 10–24)
BUN: 14 mg/dL (ref 8–27)
CO2: 23 mmol/L (ref 20–29)
Calcium: 9.5 mg/dL (ref 8.6–10.2)
Chloride: 102 mmol/L (ref 96–106)
Creatinine, Ser: 1.06 mg/dL (ref 0.76–1.27)
GFR calc Af Amer: 87 mL/min/{1.73_m2} (ref >59)
GFR calc non Af Amer: 75 mL/min/{1.73_m2} (ref >59)
Glucose: 89 mg/dL (ref 65–99)
Potassium: 3.8 mmol/L (ref 3.5–5.2)
Sodium: 137 mmol/L (ref 134–144)

## 2020-07-07 ENCOUNTER — Other Ambulatory Visit: Payer: Self-pay

## 2020-07-07 ENCOUNTER — Ambulatory Visit (INDEPENDENT_AMBULATORY_CARE_PROVIDER_SITE_OTHER): Payer: Self-pay | Admitting: Cardiothoracic Surgery

## 2020-07-07 ENCOUNTER — Encounter: Payer: Self-pay | Admitting: Cardiothoracic Surgery

## 2020-07-07 VITALS — BP 160/90 | HR 75 | Resp 20 | Ht 73.0 in | Wt 255.0 lb

## 2020-07-07 DIAGNOSIS — I25119 Atherosclerotic heart disease of native coronary artery with unspecified angina pectoris: Secondary | ICD-10-CM

## 2020-07-07 DIAGNOSIS — Z951 Presence of aortocoronary bypass graft: Secondary | ICD-10-CM

## 2020-07-07 DIAGNOSIS — Z09 Encounter for follow-up examination after completed treatment for conditions other than malignant neoplasm: Secondary | ICD-10-CM

## 2020-07-07 NOTE — Progress Notes (Signed)
PCP is Ronal Fear, NP Referring Provider is Ronal Fear, NP  Chief Complaint  Patient presents with  . Routine Post Op    2 month f/u, HX of CABG    HPI: Patient returns for scheduled postop visit 3 months after urgent multivessel CABG x4 for unstable angina.  He has done well and has been seen by his cardiologist to titrate his blood pressure medications.  He is anxious to return to work at the [employer].  He denies any symptoms of angina or CHF and surgical incisions are well-healed.  His postop chest x-ray is clear.  Today we talked about returning to work in the [employer].  He understands that he can return on January 14 but he should limit heavy lifting > 25 lbs until mid February and limit climbing the tall ladders until March. Past Medical History:  Diagnosis Date  . Abnormal nuclear stress test 03/11/2020  . Allergy 02/25/2020  . Angina pectoris (HCC) 03/11/2020  . Atrial fibrillation (HCC) 04/22/2020  . Benign essential hypertension   . CAD (coronary artery disease) 04/07/2020  . Cardiac murmur 02/26/2020  . Chest tightness 02/26/2020  . 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 Coronary artery disease   . Coronary atherosclerosis 04/07/2020  . Drug therapy 10/18/2015  . Dyslipidemia 10/18/2015  . Dyspnea   . Essential hypertension 02/25/2020  . GERD (gastroesophageal reflux disease) 10/18/2015  . GERD without esophagitis   . Hyperlipidemia   . Liver enzyme elevation   . Obesity   . Obesity, Class I, BMI 30-34.9 10/18/2015  . Postop check 05/12/2020  . 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/15/2020  . Spinal stenosis, lumbar   . Statin intolerance   . Urinary retention with incomplete bladder emptying 04/22/2020    Past Surgical History:  Procedure Laterality Date  . CARDIAC CATHETERIZATION    . CORONARY ARTERY BYPASS GRAFT N/A 04/15/2020   Procedure: Coronary artery bypass grafting times four using left internal mammary artery and endoscopically harvested right  saphenous vein graft;  Surgeon: Kerin Perna, MD;  Location: [facility] OR;  Service: Open Heart Surgery;  Laterality: N/A;  . Cosmetic ear surgery bilateral, childhood    . LEFT HEART CATH AND CORONARY ANGIOGRAPHY N/A 03/16/2020   Procedure: LEFT HEART CATH AND CORONARY ANGIOGRAPHY;  Surgeon: Lyn Records, MD;  Location: [facility] INVAS[facility] CV LAB;  Service: Cardiovascular;  Laterality: N/A;  . TEE WITHOUT CARDIOVERSION N/A 04/15/2020   Procedure: TRANSESOPHAGEAL ECHOCARDIOGRAM (TEE);  Surgeon: Donata Clay, Theron Arista, MD;  Location: [facility] OR;  Service: Open Heart Surgery;  Laterality: N/A;  . 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 Hypercholesterolemia Mother   . CAD Mother   . Heart attack Mother   . CAD Father   . Heart attack Father   . Stroke Father   . Hypercholesterolemia Brother     Social History Social History   Tobacco Use  . Smoking status: Current Every Day Smoker    Types: Cigarettes  . Smokeless tobacco: Current User    Types: Snuff, Chew  Substance Use Topics  . Alcohol use: Never    Current Outpatient Medications  Medication Sig Dispense Refill  . aspirin 81 MG EC tablet Take 81 mg by mouth daily.    . cholecalciferol (VITAMIN D3) 25 MCG (1000 UNIT) tablet Take 1,000 Units by mouth daily.    Marland Kitchen ezetimibe (ZETIA) 10 MG  tablet Take 10 mg by mouth daily.    . hydrochlorothiazide (HYDRODIURIL) 25 MG tablet Take 0.5 tablets (12.5 mg total) by mouth daily. 45 tablet 3  . losartan (COZAAR) 100 MG tablet Take 1 tablet (100 mg total) by mouth daily. 90 tablet 1  . polycarbophil (FIBERCON) 625 MG tablet Take 625 mg by mouth daily.    . rosuvastatin (CRESTOR) 20 MG tablet Take 1 tablet (20 mg total) by mouth daily. 30 tablet 11  . tamsulosin (FLOMAX) 0.4 MG CAPS capsule Take 1 capsule (0.4 mg total) by mouth daily. 30 capsule 3  . nitroGLYCERIN (NITROSTAT) 0.4 MG SL tablet Place 1 tablet (0.4 mg total) under the tongue every  5 (five) minutes as needed. (Patient not taking: Reported on 07/07/2020) 25 tablet 6   No current facility-administered medications for this visit.    Allergies  Allergen Reactions  . Penicillins Swelling and Rash       . Augmentin [Amoxicillin-Pot Clavulanate] Swelling  . Zocor [Simvastatin] Swelling    Hands swell    Review of Systems  No clicking or popping sensation from his sternal incision No ankle edema Weight stable No symptoms of Covid pneumonitis-fever cough shortness of breath loss of taste  BP (!) 160/90   Pulse 75   Resp 20   Ht 6\' 1"  (1.854 m)   Wt 255 lb (115.7 kg)   SpO2 97% Comment: RA  BMI 33.64 kg/m  Physical Exam      Exam    General- alert and comfortable.  Surgical incisions well-healed.    Neck- no JVD, no cervical adenopathy palpable, no carotid bruit   Lungs- clear without rales, wheezes   Cor- regular rate and rhythm, no murmur , gallop   Abdomen- soft, non-tender   Extremities - warm, non-tender, minimal edema   Neuro- oriented, appropriate, no focal weakness   Diagnostic Tests: None  Impression: Excellent early recovery almost 3 months after urgent CABG x4 To new current medications.  The patient understands importance of heart healthy lifestyle including heart healthy diet and regular aerobic activity 30 minutes daily for 5 days a week  Plan: Return as needed   , MD [facility] [phone]

## 2020-08-12 ENCOUNTER — Encounter (HOSPITAL_BASED_OUTPATIENT_CLINIC_OR_DEPARTMENT_OTHER): Payer: Self-pay

## 2020-08-13 ENCOUNTER — Telehealth (HOSPITAL_COMMUNITY): Payer: Self-pay | Admitting: Internal Medicine

## 2020-08-13 NOTE — Telephone Encounter (Signed)
Called to discuss with patient about COVID-19 symptoms and the use of one of the available treatments for those with mild to moderate Covid symptoms and at a high risk of hospitalization.  Pt appears to qualify for outpatient treatment due to co-morbid conditions and/or a member of an at-risk group in accordance with the FDA Emergency Use Authorization.    Symptom onset: 2/2 Vaccinated: yes Booster? unknown Immunocompromised? no Qualifiers: multiple risk factors, CAD, htn, BMI  Paxlovid already rx'd on 2/10 by PCP. Informed pt he is out of the tx window so not sure how effective medication would be and will leave it up to him if he wants to take. He still has some mild congestion. Informed him he would need to hold Flomax while on Paxlovid if he decides to take.   Cyndee Brightly, NP [facility]

## 2020-11-18 ENCOUNTER — Ambulatory Visit (INDEPENDENT_AMBULATORY_CARE_PROVIDER_SITE_OTHER): Payer: Commercial Managed Care - PPO | Admitting: Cardiology

## 2020-11-18 ENCOUNTER — Encounter: Payer: Self-pay | Admitting: Cardiology

## 2020-11-18 ENCOUNTER — Other Ambulatory Visit: Payer: Self-pay

## 2020-11-18 VITALS — BP 132/62 | HR 68 | Ht 73.0 in | Wt 255.2 lb

## 2020-11-18 DIAGNOSIS — I10 Essential (primary) hypertension: Secondary | ICD-10-CM

## 2020-11-18 DIAGNOSIS — I2510 Atherosclerotic heart disease of native coronary artery without angina pectoris: Secondary | ICD-10-CM

## 2020-11-18 DIAGNOSIS — Z1329 Encounter for screening for other suspected endocrine disorder: Secondary | ICD-10-CM

## 2020-11-18 DIAGNOSIS — E782 Mixed hyperlipidemia: Secondary | ICD-10-CM

## 2020-11-18 DIAGNOSIS — Z951 Presence of aortocoronary bypass graft: Secondary | ICD-10-CM | POA: Diagnosis not present

## 2020-11-18 NOTE — Patient Instructions (Signed)

## 2020-11-18 NOTE — Progress Notes (Signed)
Cardiology Office Note:    Date:  11/18/2020   ID:  Dan Nelson, DOB 25-Feb-1956, MRN [id number]  PCP:  Ronal Fear, NP  Cardiologist:  Garwin Brothers, MD   Referring MD: Ronal Fear, NP    ASSESSMENT:    1. Coronary artery disease involving native coronary artery of native heart without angina pectoris   2. S/P CABG x 4   3. Essential (primary) hypertension   4. Mixed hyperlipidemia    PLAN:    In order of problems listed above:  1. Coronary artery disease: Secondary prevention stressed with the patient.  Importance of compliance with diet medication stressed any vocalized understanding.  He was advised to walk at least half an hour a day 5 days a week and he vocalized understanding and promises to do so. 2. Essential hypertension: Blood pressure stable and diet was emphasized.  Lifestyle modification urged 3. Mixed dyslipidemia: Lipids were reviewed.  Diet was emphasized.  He is fasting and will have complete blood work today including fasting lipids 4. Obesity: Weight reduction was stressed.  Risks of obesity explained and he understands. 5. Patient will be seen in follow-up appointment in 6 months or earlier if the patient has any concerns    Medication Adjustments/Labs and Tests Ordered: Current medicines are reviewed at length with the patient today.  Concerns regarding medicines are outlined above.  No orders of the defined types were placed in this encounter.  No orders of the defined types were placed in this encounter.    No chief complaint on file.    History of Present Illness:    Dan Nelson is a 65 y.o. male.  Patient has past medical history of coronary artery disease post CABG surgery, essential hypertension, mixed dyslipidemia and obesity.  He denies any problems at this time and takes care of activities of daily living.  No chest pain orthopnea or PND.  At the time of my evaluation, the patient is alert awake oriented and in no distress.  Past  Medical History:  Diagnosis Date  . Abnormal nuclear stress test 03/11/2020  . Allergy 02/25/2020  . Angina pectoris (HCC) 03/11/2020  . Atherosclerotic heart disease 04/07/2020  . Atrial fibrillation (HCC) 04/22/2020  . Benign essential hypertension   . Body mass index (BMI) 33.0-33.9, adult 10/18/2015  . CAD (coronary artery disease) 04/07/2020  . Cardiac murmur 02/26/2020  . Chest tightness 02/26/2020  . 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 Coronary artery disease   . Coronary atherosclerosis 04/07/2020  . Drug therapy 10/18/2015  . Dyslipidemia 10/18/2015  . Dyspnea   . Essential (primary) hypertension 03/11/2019  . Essential hypertension 02/25/2020  . GERD (gastroesophageal reflux disease) 10/18/2015  . GERD without esophagitis   . Hyperlipidemia   . Liver enzyme elevation   . Lumbar radiculopathy 03/11/2019  . Obesity   . Obesity, Class I, BMI 30-34.9 10/18/2015  . Postop check 05/12/2020  . 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/15/2020  . Spinal stenosis, lumbar   . Statin intolerance   . Urinary retention with incomplete bladder emptying 04/22/2020    Past Surgical History:  Procedure Laterality Date  . CARDIAC CATHETERIZATION    . CORONARY ARTERY BYPASS GRAFT N/A 04/15/2020   Procedure: Coronary artery bypass grafting times four using left internal mammary artery and endoscopically harvested right saphenous vein graft;  Surgeon: Kerin Perna, MD;  Location: [facility] OR;  Service: Open Heart Surgery;  Laterality: N/A;  . Cosmetic ear surgery bilateral,  childhood    . LEFT HEART CATH AND CORONARY ANGIOGRAPHY N/A 03/16/2020   Procedure: LEFT HEART CATH AND CORONARY ANGIOGRAPHY;  Surgeon: Lyn Records, MD;  Location: [facility] INVASIVE CV LAB;  Service: Cardiovascular;  Laterality: N/A;  . TEE WITHOUT CARDIOVERSION N/A 04/15/2020   Procedure: TRANSESOPHAGEAL ECHOCARDIOGRAM (TEE);  Surgeon: Donata Clay, Theron Arista, MD;  Location: [facility] OR;  Service: Open Heart Surgery;  Laterality: N/A;  .  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 aspirin 81 MG EC tablet Take 81 mg by mouth daily.  . cholecalciferol (VITAMIN D3) 25 MCG (1000 UNIT) tablet Take 1,000 Units by mouth daily.  Marland Kitchen ezetimibe (ZETIA) 10 MG tablet Take 10 mg by mouth daily.  . hydrochlorothiazide (HYDRODIURIL) 25 MG tablet Take 12.5 mg by mouth daily.  Marland Kitchen losartan (COZAAR) 100 MG tablet Take 1 tablet (100 mg total) by mouth daily.  . nitroGLYCERIN (NITROSTAT) 0.4 MG SL tablet Place 0.4 mg under the tongue every 5 (five) minutes as needed for chest pain.  . polycarbophil (FIBERCON) 625 MG tablet Take 625 mg by mouth daily.  . rosuvastatin (CRESTOR) 20 MG tablet Take 1 tablet (20 mg total) by mouth daily.  . tamsulosin (FLOMAX) 0.4 MG CAPS capsule Take 1 capsule (0.4 mg total) by mouth daily.     Allergies:   Penicillins, Augmentin [amoxicillin-pot clavulanate], and Zocor [simvastatin]   Social History   Socioeconomic History  . Marital status: Married    Spouse name: Not on file  . Number of children: Not on file  . Years of education: Not on file  . Highest education level: Not on file  Occupational History  . Not on file  Tobacco Use  . Smoking status: Current Every Day Smoker    Types: Cigarettes  . Smokeless tobacco: Current User    Types: Snuff, Chew  Substance and Sexual Activity  . Alcohol use: Never  . Drug use: Not on file  . Sexual activity: Not on file  Other Topics Concern  . Not on file  Social History Narrative  . Not on file   Social Determinants of Health   Financial Resource Strain: Not on file  Food Insecurity: Not on file  Transportation Needs: Not on file  Physical Activity: Not on file  Stress: Not on file  Social Connections: Not on file     Family History: The patient's family history includes CAD in his father and mother; Heart attack in his father and mother; Hypercholesterolemia in his brother and  mother; Stroke in his father.  ROS:   Please see the history of present illness.    All other systems reviewed and are negative.  EKGs/Labs/Other Studies Reviewed:    The following studies were reviewed today: I discussed my findings with the patient in extensive length   Recent Labs: 04/18/2020: Magnesium 2.6 05/21/2020: ALT 26; Hemoglobin 14.1; Platelets 276; TSH 2.780 06/18/2020: BUN 14; Creatinine, Ser 1.06; Potassium 3.8; Sodium 137  Recent Lipid Panel    Component Value Date/Time   CHOL 160 03/02/2020 1002   TRIG 115 03/02/2020 1002   HDL 36 (L) 03/02/2020 1002   CHOLHDL 4.4 03/02/2020 1002   LDLCALC 103 (H) 03/02/2020 1002    Physical Exam:    VS:  BP 132/62   Pulse 68   Ht 6\' 1"  (1.854 m)   Wt 255 lb 3.2 oz (115.8 kg)   SpO2 95%   BMI 33.67  kg/m     Wt Readings from Last 3 Encounters:  11/18/20 255 lb 3.2 oz (115.8 kg)  07/07/20 255 lb (115.7 kg)  06/01/20 251 lb 9.6 oz (114.1 kg)     GEN: Patient is in no acute distress HEENT: Normal NECK: No JVD; No carotid bruits LYMPHATICS: No lymphadenopathy CARDIAC: Hear sounds regular, 2/6 systolic murmur at the apex. RESPIRATORY:  Clear to auscultation without rales, wheezing or rhonchi  ABDOMEN: Soft, non-tender, non-distended MUSCULOSKELETAL:  No edema; No deformity  SKIN: Warm and dry NEUROLOGIC:  Alert and oriented x 3 PSYCHIATRIC:  Normal affect   Signed, Garwin Brothers, MD  11/18/2020 9:22 AM    [facility]

## 2020-11-19 LAB — BASIC METABOLIC PANEL
BUN/Creatinine Ratio: 14 (ref 10–24)
BUN: 14 mg/dL (ref 8–27)
CO2: 23 mmol/L (ref 20–29)
Calcium: 9.3 mg/dL (ref 8.6–10.2)
Chloride: 103 mmol/L (ref 96–106)
Creatinine, Ser: 0.98 mg/dL (ref 0.76–1.27)
Glucose: 94 mg/dL (ref 65–99)
Potassium: 4.2 mmol/L (ref 3.5–5.2)
Sodium: 141 mmol/L (ref 134–144)
eGFR: 87 mL/min/{1.73_m2} (ref >59)

## 2020-11-19 LAB — CBC WITH DIFFERENTIAL/PLATELET
Basophils Absolute: 0.1 10*3/uL (ref 0.0–0.2)
Basos: 1 %
EOS (ABSOLUTE): 0.1 10*3/uL (ref 0.0–0.4)
Eos: 2 %
Hematocrit: 45.6 % (ref 37.5–51.0)
Hemoglobin: 16.2 g/dL (ref 13.0–17.7)
Immature Grans (Abs): 0 10*3/uL (ref 0.0–0.1)
Immature Granulocytes: 0 %
Lymphocytes Absolute: 2 10*3/uL (ref 0.7–3.1)
Lymphs: 26 %
MCH: 32.3 pg (ref 26.6–33.0)
MCHC: 35.5 g/dL (ref 31.5–35.7)
MCV: 91 fL (ref 79–97)
Monocytes Absolute: 0.7 10*3/uL (ref 0.1–0.9)
Monocytes: 9 %
Neutrophils Absolute: 4.8 10*3/uL (ref 1.4–7.0)
Neutrophils: 62 %
Platelets: 235 10*3/uL (ref 150–450)
RBC: 5.01 x10E6/uL (ref 4.14–5.80)
RDW: 11.9 % (ref 11.6–15.4)
WBC: 7.7 10*3/uL (ref 3.4–10.8)

## 2020-11-19 LAB — LIPID PANEL
Chol/HDL Ratio: 3.3 ratio (ref 0.0–5.0)
Cholesterol, Total: 115 mg/dL (ref 100–199)
HDL: 35 mg/dL — ABNORMAL LOW (ref >39)
LDL Chol Calc (NIH): 64 mg/dL (ref 0–99)
Triglycerides: 77 mg/dL (ref 0–149)
VLDL Cholesterol Cal: 16 mg/dL (ref 5–40)

## 2020-11-19 LAB — HEPATIC FUNCTION PANEL
ALT: 26 IU/L (ref 0–44)
AST: 24 IU/L (ref 0–40)
Albumin: 4.4 g/dL (ref 3.8–4.8)
Alkaline Phosphatase: 61 IU/L (ref 44–121)
Bilirubin Total: 0.6 mg/dL (ref 0.0–1.2)
Bilirubin, Direct: 0.17 mg/dL (ref 0.00–0.40)
Total Protein: 6.8 g/dL (ref 6.0–8.5)

## 2020-11-19 LAB — TSH: TSH: 1.04 u[IU]/mL (ref 0.450–4.500)

## 2021-01-14 ENCOUNTER — Other Ambulatory Visit: Payer: Self-pay | Admitting: Interventional Cardiology

## 2021-02-24 IMAGING — CR DG CHEST 2V
2 series · 2 of 2 positions shown · non-contrast
Comparison: 09/27/2012

CLINICAL DATA: Preop CABG.

EXAM:
CHEST - 2 VIEW

[w chest pa]
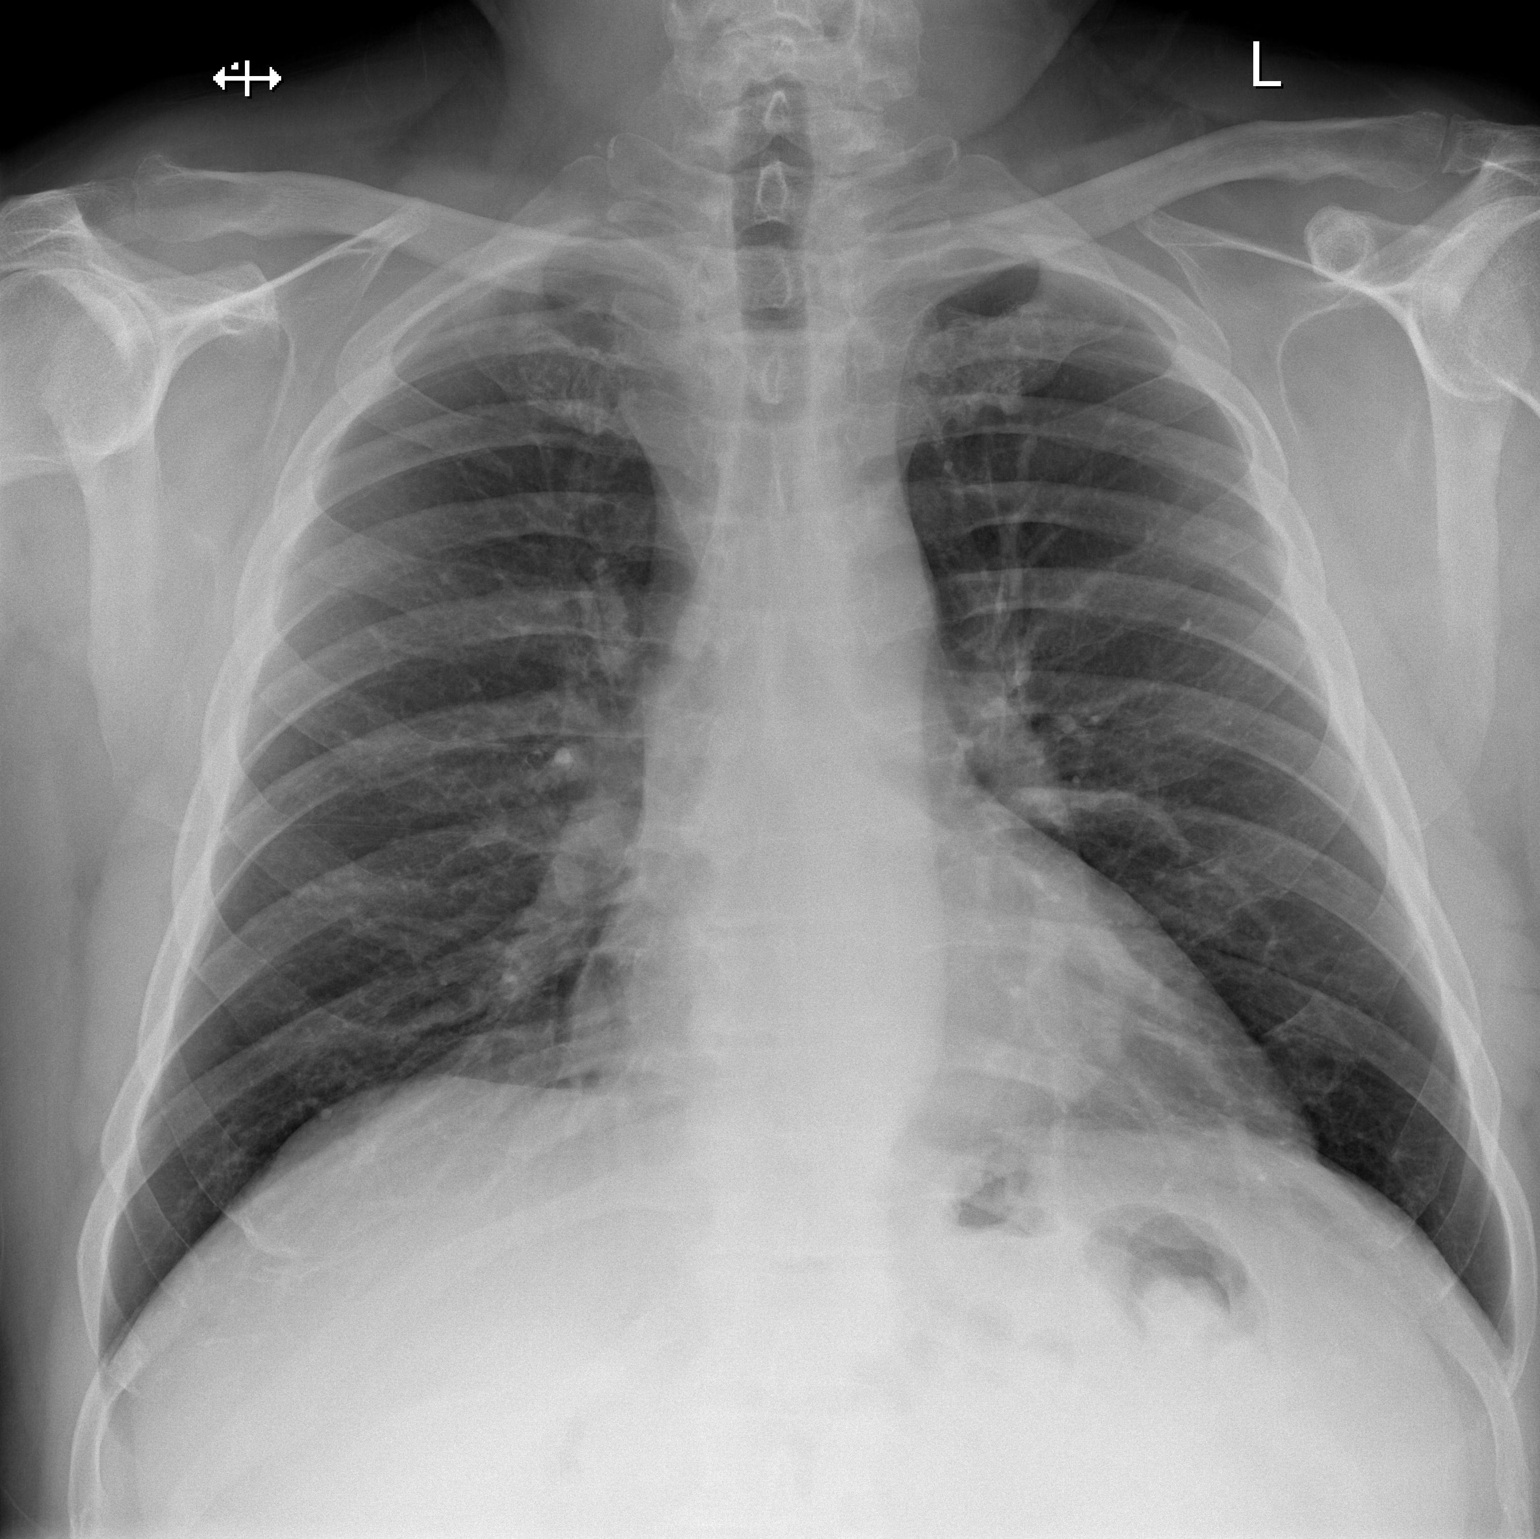

[w chest lat]
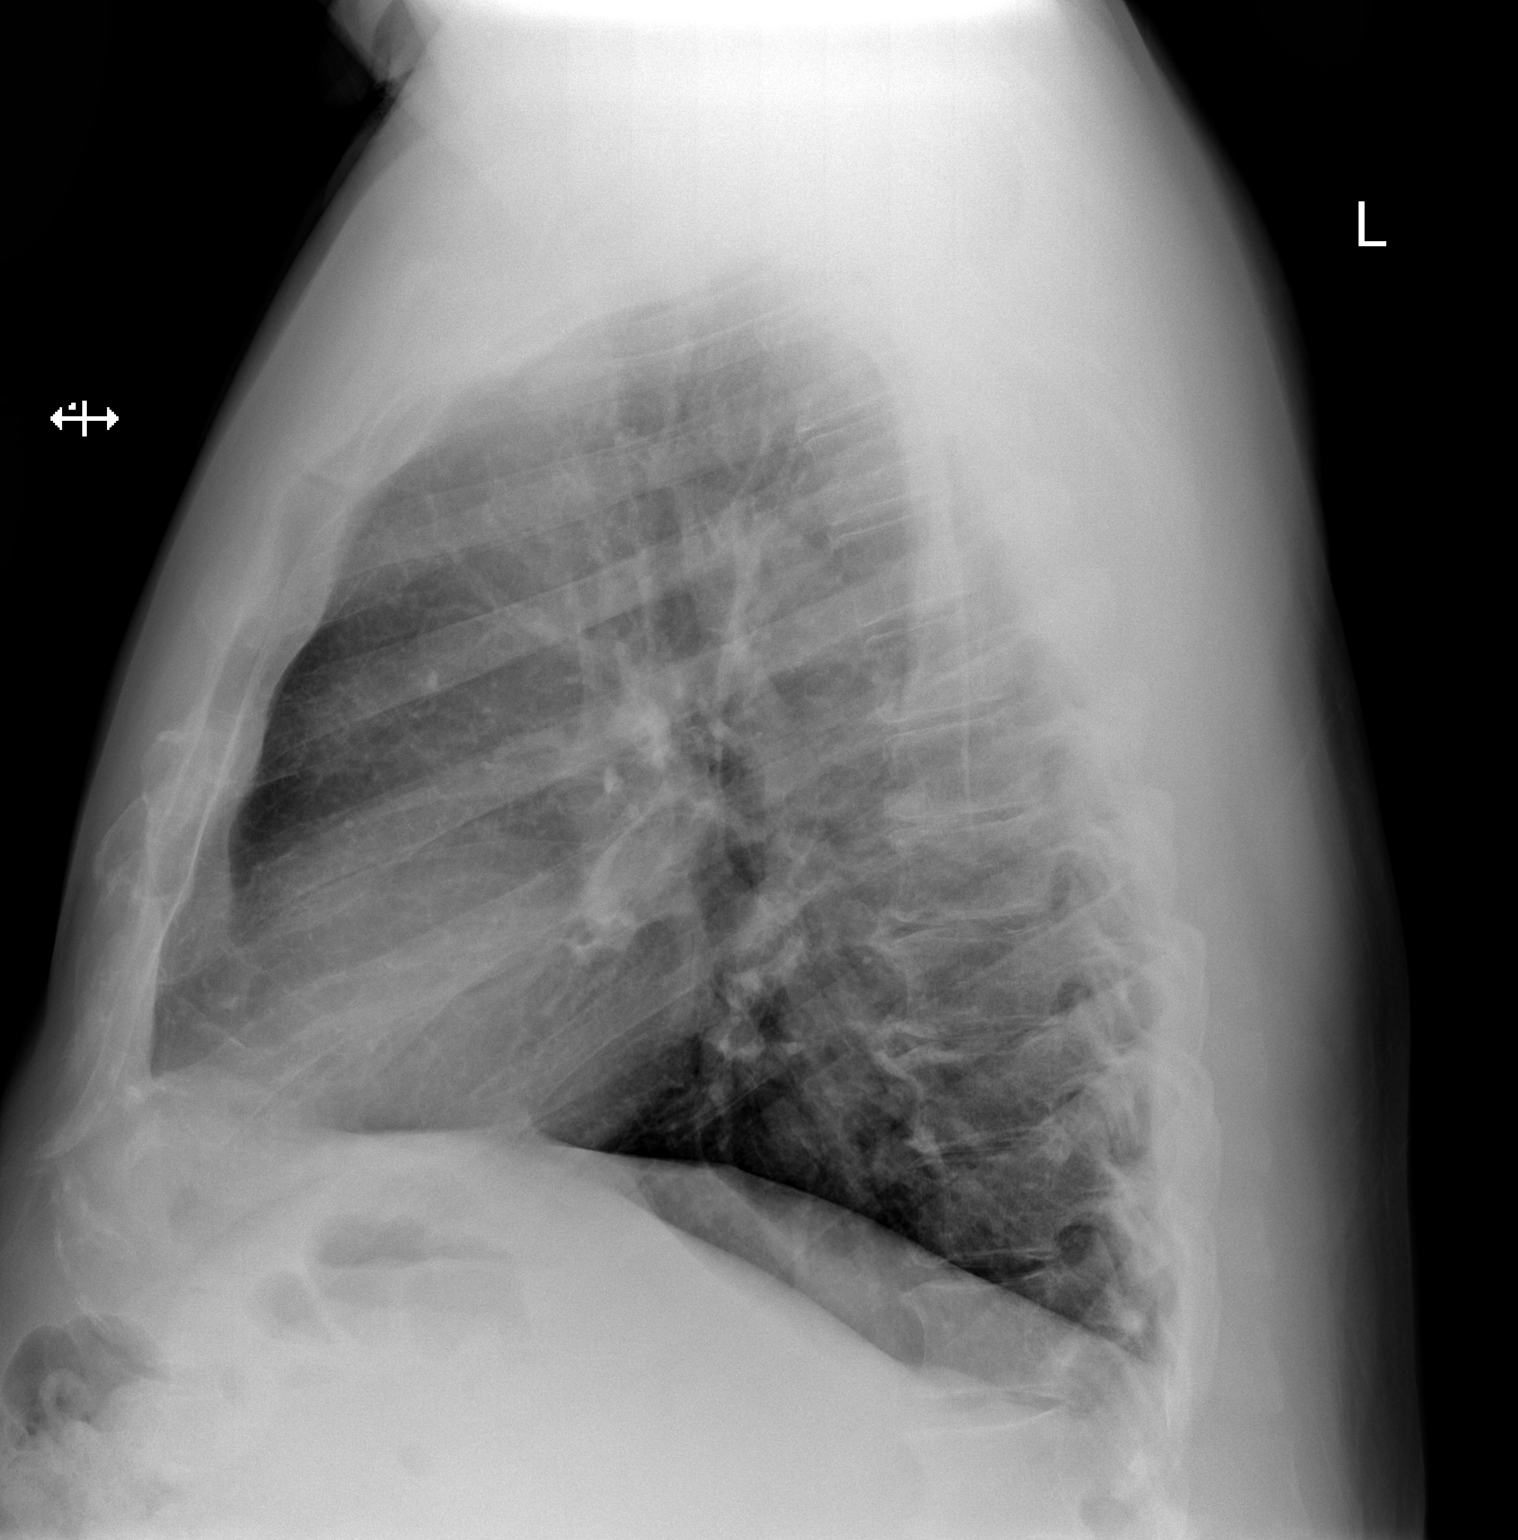

[2 of 2 positions shown; findings below may reference images not displayed]

FINDINGS: The cardiomediastinal silhouette is unchanged with normal heart
size. No confluent airspace opacity, edema, pleural effusion, or
pneumothorax is identified. No acute osseous abnormality is seen.
IMPRESSION: No active cardiopulmonary disease.

## 2021-02-26 IMAGING — CR DG CHEST 1V PORT
1 series · 1 of 1 positions shown · non-contrast
Comparison: 04/13/2020

CLINICAL DATA: Status post cardiac surgery, evaluate line placement

EXAM:
PORTABLE CHEST 1 VIEW

[AP]
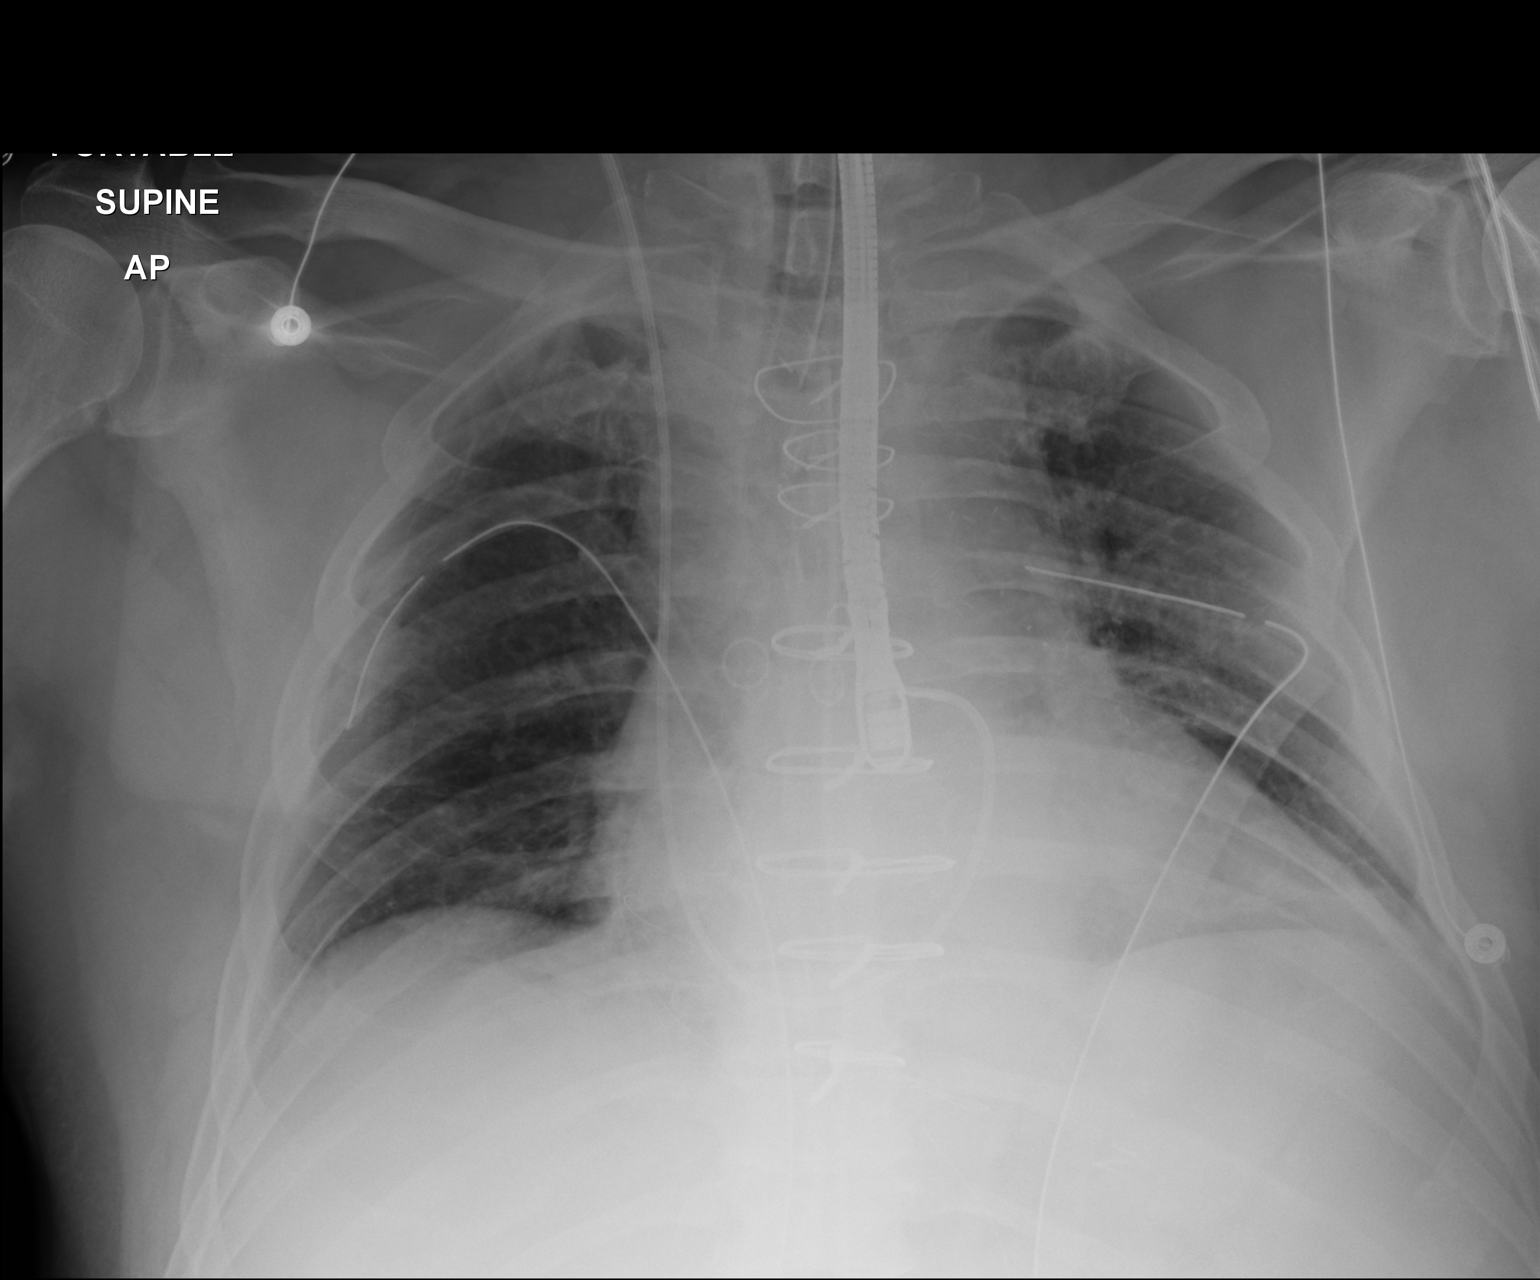

[1 of 1 positions shown; findings below may reference images not displayed]

FINDINGS: Endotracheal tube is noted in satisfactory position. Mediastinal
drain and bilateral thoracostomy catheters are noted. No
pneumothorax is seen. Minimal left basilar atelectasis is noted.
Swan-Ganz catheter is noted in the pulmonary outflow tract.
Postsurgical changes are noted consistent with the coronary bypass
grafting. Transesophageal echo probe is noted.
IMPRESSION: No pneumothorax.

Tubes and lines as described in satisfactory position.

## 2021-03-03 IMAGING — DX DG CHEST 1V PORT
1 series · 1 of 1 positions shown · non-contrast
Comparison: 04/17/2020.

CLINICAL DATA: Removal of bilateral chest tubes.

EXAM:
PORTABLE CHEST 1 VIEW

[chest]
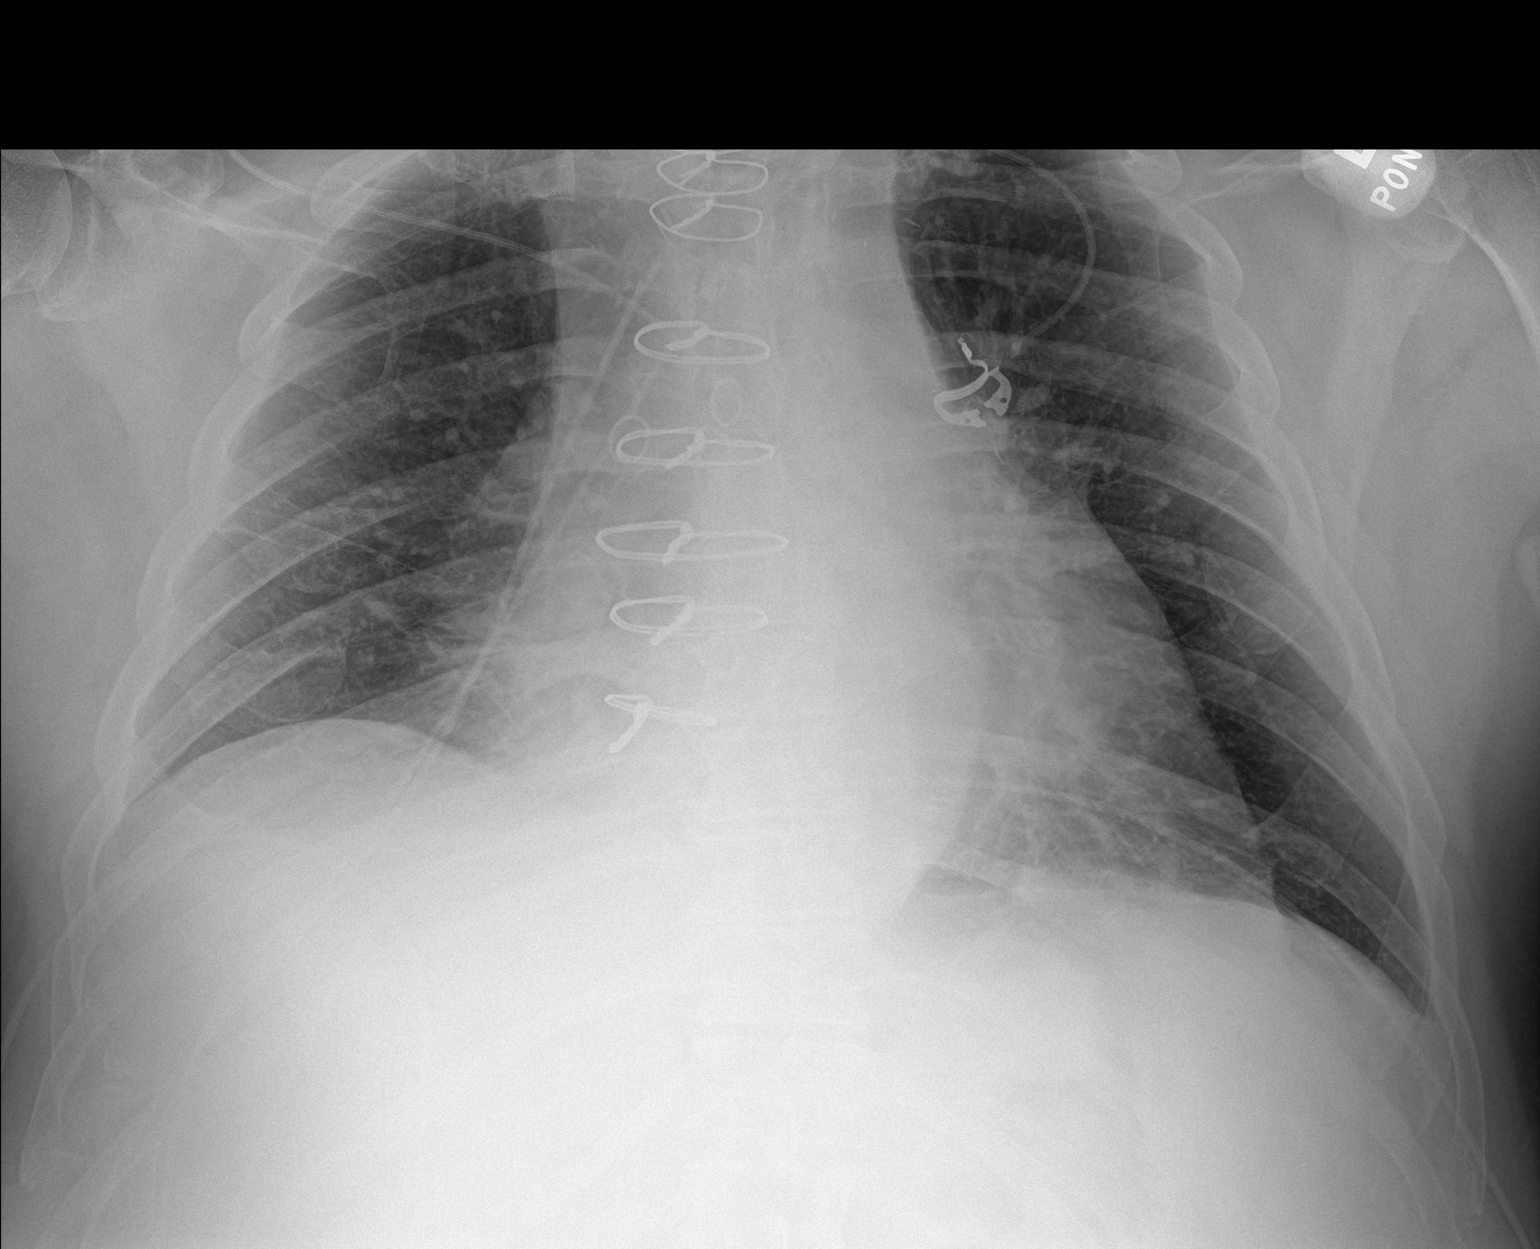

[1 of 1 positions shown; findings below may reference images not displayed]

FINDINGS: Interim removal of bilateral chest tubes. Right IJ sheath in stable
position. Prior CABG. Stable cardiomegaly. Low lung volumes with
mild bibasilar atelectasis. Aeration from prior exam no pleural
effusion or pneumothorax.
IMPRESSION: 1. Interim removal of bilateral chest tubes. No pneumothorax.
2. Prior CABG. Stable cardiomegaly.

## 2021-05-15 ENCOUNTER — Other Ambulatory Visit: Payer: Self-pay | Admitting: Cardiology

## 2021-06-09 ENCOUNTER — Other Ambulatory Visit: Payer: Self-pay | Admitting: Cardiology

## 2021-06-22 ENCOUNTER — Other Ambulatory Visit: Payer: Self-pay

## 2021-07-08 ENCOUNTER — Other Ambulatory Visit: Payer: Self-pay

## 2021-07-08 ENCOUNTER — Ambulatory Visit (INDEPENDENT_AMBULATORY_CARE_PROVIDER_SITE_OTHER): Payer: Commercial Managed Care - PPO | Admitting: Cardiology

## 2021-07-08 ENCOUNTER — Encounter: Payer: Self-pay | Admitting: Cardiology

## 2021-07-08 VITALS — BP 138/80 | HR 73 | Ht 74.0 in | Wt 252.0 lb

## 2021-07-08 DIAGNOSIS — E785 Hyperlipidemia, unspecified: Secondary | ICD-10-CM | POA: Diagnosis not present

## 2021-07-08 DIAGNOSIS — Z951 Presence of aortocoronary bypass graft: Secondary | ICD-10-CM

## 2021-07-08 DIAGNOSIS — I2510 Atherosclerotic heart disease of native coronary artery without angina pectoris: Secondary | ICD-10-CM

## 2021-07-08 DIAGNOSIS — I10 Essential (primary) hypertension: Secondary | ICD-10-CM | POA: Diagnosis not present

## 2021-07-08 NOTE — Patient Instructions (Signed)
Medication Instructions:  °Your physician recommends that you continue on your current medications as directed. Please refer to the Current Medication list given to you today. ° °*If you need a refill on your cardiac medications before your next appointment, please call your pharmacy* ° ° °Lab Work: °NONE °If you have labs (blood work) drawn today and your tests are completely normal, you will receive your results only by: °MyChart Message (if you have MyChart) OR °A paper copy in the mail °If you have any lab test that is abnormal or we need to change your treatment, we will call you to review the results. ° ° °Testing/Procedures: °NONE ° ° °Follow-Up: °At [facility], you and your health needs are our priority.  As part of our continuing mission to provide you with exceptional heart care, we have created designated Provider Care Teams.  These Care Teams include your primary Cardiologist (physician) and Advanced Practice Providers (APPs -  Physician Assistants and Nurse Practitioners) who all work together to provide you with the care you need, when you need it. ° °We recommend signing up for the patient portal called "MyChart".  Sign up information is provided on this After Visit Summary.  MyChart is used to connect with patients for Virtual Visits (Telemedicine).  Patients are able to view lab/test results, encounter notes, upcoming appointments, etc.  Non-urgent messages can be sent to your provider as well.   °To learn more about what you can do with MyChart, go to [email or web address].   ° °Your next appointment:   °9 month(s) ° °The format for your next appointment:   °In Person ° °Provider:   °Rajan Revankar, MD  ° ° °Other Instructions ° °

## 2021-07-08 NOTE — Progress Notes (Signed)
Cardiology Office Note:    Date:  07/08/2021   ID:  Dan Nelson, DOB 08/22/1955, MRN [id number]  PCP:  Ronal FearLam, Lynn E, NP  Cardiologist:  Garwin Brothersajan R Makalyn Lennox, MD   Referring MD: Ronal FearLam, Lynn E, NP    ASSESSMENT:    1. Coronary artery disease involving native coronary artery of native heart without angina pectoris   2. Essential (primary) hypertension   3. S/P CABG x 4   4. Dyslipidemia    PLAN:    In order of problems listed above:  Coronary artery disease: Secondary prevention stressed to the patient.  Importance of compliance with diet medication stressed and vocalized understanding.  He was advised to walk at least half an hour a day 5 days a week and he promises to do so. Essential hypertension: Blood pressure stable and diet was emphasized.  Lifestyle modification urged. Mixed dyslipidemia: On statin therapy and doing well.  Diet emphasized. Obesity: Weight reduction stressed and he promises to do better. Patient will be seen in follow-up appointment in 6 months or earlier if the patient has any concerns    Medication Adjustments/Labs and Tests Ordered: Current medicines are reviewed at length with the patient today.  Concerns regarding medicines are outlined above.  Orders Placed This Encounter  Procedures   EKG 12-Lead   No orders of the defined types were placed in this encounter.    No chief complaint on file.    History of Present Illness:    Dan RaRichard Calamari is a 66 y.o. male.  Patient has past medical history of coronary artery disease, essential hypertension and dyslipidemia.  He denies any problems at this time and takes care of activities of daily living.  No chest pain orthopnea or PND.  He walks on a regular basis.  At the time of my evaluation, the patient is alert awake oriented and in no distress.  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    Coronary artery disease    Coronary atherosclerosis 04/07/2020   Drug therapy 10/18/2015   Dyslipidemia 10/18/2015   Dyspnea    Essential (primary) hypertension 03/11/2019   GERD (gastroesophageal reflux disease) 10/18/2015   GERD without esophagitis    Hyperlipidemia    Liver enzyme elevation    Lumbar radiculopathy 03/11/2019   Obesity    Postop check 05/12/2020   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/15/2020   Spinal stenosis, lumbar    Statin intolerance    Urinary retention with incomplete bladder emptying 04/22/2020    Past Surgical History:  Procedure Laterality Date   CARDIAC CATHETERIZATION     CORONARY ARTERY BYPASS GRAFT N/A 04/15/2020   Procedure: Coronary artery bypass grafting times four using left internal mammary artery and endoscopically harvested right saphenous vein graft;  Surgeon: Kerin PernaVan Trigt, Peter, MD;  [facility];  Service: Open Heart Surgery;  Laterality: N/A;   Cosmetic ear surgery bilateral, childhood     LEFT HEART CATH AND CORONARY ANGIOGRAPHY N/A 03/16/2020   Procedure: LEFT HEART CATH AND CORONARY ANGIOGRAPHY;  Surgeon: Lyn RecordsSmith, Henry W, MD;  Location: [facility] INVASIVE CV LAB;  Service: Cardiovascular;  Laterality: N/A;   TEE WITHOUT CARDIOVERSION N/A 04/15/2020   Procedure: TRANSESOPHAGEAL ECHOCARDIOGRAM (TEE);  Surgeon: Donata Clay, Theron Arista, MD;  Location: [facility] OR;  Service: Open Heart Surgery;  Laterality: N/A;   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   cholecalciferol (VITAMIN D3) 25 MCG (1000 UNIT) tablet Take 1,000 Units by mouth  daily.   finasteride (PROSCAR) 5 MG tablet Take 5 mg by mouth daily.   hydrochlorothiazide (HYDRODIURIL) 25 MG tablet Take 0.5 tablets (12.5 mg total) by mouth daily.   losartan (COZAAR) 100 MG tablet TAKE 1 TABLET(100 MG) BY MOUTH DAILY   nitroGLYCERIN (NITROSTAT) 0.4 MG SL tablet Place 0.4 mg under the tongue every 5 (five) minutes as needed for chest pain.   polycarbophil (FIBERCON) 625 MG tablet Take 625 mg by mouth daily.   rosuvastatin (CRESTOR) 20 MG tablet TAKE 1 TABLET(20 MG) BY MOUTH DAILY   tamsulosin (FLOMAX) 0.4 MG CAPS capsule Take 1 capsule (0.4 mg total) by mouth daily.     Allergies:   Penicillins, Augmentin [amoxicillin-pot clavulanate], and Zocor [simvastatin]   Social History   Socioeconomic History   Marital status: Married    Spouse name: Not on file   Number of children: Not on file   Years of education: Not on file   Highest education level: Not on file  Occupational History   Not on file  Tobacco Use   Smoking status: Every Day    Types: Cigarettes   Smokeless tobacco: Current    Types: Snuff, Chew  Substance and Sexual Activity   Alcohol use: Never   Drug use: Not on file   Sexual activity: Not on file  Other Topics Concern   Not on file  Social History Narrative   Not on file   Social Determinants of Health   Financial Resource Strain: Not on file  Food Insecurity: Not on file  Transportation Needs: Not on file  Physical Activity: Not on file  Stress: Not on file  Social Connections: Not on file     Family History: The patient's family history includes CAD in his father and mother; Heart attack in his father and mother; Hypercholesterolemia in his brother and mother; Stroke in his father.  ROS:   Please see the history of present illness.    All other systems reviewed and are negative.  EKGs/Labs/Other Studies Reviewed:    The following studies were reviewed today: I discussed my findings with the patient at length.   Recent  Labs: 11/18/2020: ALT 26; BUN 14; Creatinine, Ser 0.98; Hemoglobin 16.2; Platelets 235; Potassium 4.2; Sodium 141; TSH 1.040  Recent Lipid Panel    Component Value Date/Time   CHOL 115 11/18/2020 0947   TRIG 77 11/18/2020 0947   HDL 35 (L) 11/18/2020 0947   CHOLHDL 3.3 11/18/2020 0947   LDLCALC 64 11/18/2020 0947    Physical Exam:    VS:  BP 138/80    Pulse 73    Ht 6\' 2"  (1.88 m)    Wt 252 lb (114.3 kg)    SpO2 96%    BMI 32.35 kg/m     Wt Readings from Last 3 Encounters:  07/08/21 252 lb (114.3 kg)  11/18/20 255 lb 3.2 oz (115.8 kg)  07/07/20 255 lb (115.7 kg)     GEN: Patient is in no acute distress  HEENT: Normal NECK: No JVD; No carotid bruits LYMPHATICS: No lymphadenopathy CARDIAC: Hear sounds regular, 2/6 systolic murmur at the apex. RESPIRATORY:  Clear to auscultation without rales, wheezing or rhonchi  ABDOMEN: Soft, non-tender, non-distended MUSCULOSKELETAL:  No edema; No deformity  SKIN: Warm and dry NEUROLOGIC:  Alert and oriented x 3 PSYCHIATRIC:  Normal affect   Signed, Garwin Brothers, MD  07/08/2021 4:16 PM    [facility]

## 2021-09-06 ENCOUNTER — Encounter: Payer: Self-pay | Admitting: Cardiology

## 2021-09-06 DIAGNOSIS — R0609 Other forms of dyspnea: Secondary | ICD-10-CM

## 2021-09-06 DIAGNOSIS — I2510 Atherosclerotic heart disease of native coronary artery without angina pectoris: Secondary | ICD-10-CM

## 2021-09-08 ENCOUNTER — Telehealth: Payer: Self-pay

## 2021-09-08 NOTE — Addendum Note (Signed)
Addended by: Jyl Heinz R on: 09/08/2021 11:55 AM   Modules accepted: Orders

## 2021-09-08 NOTE — Addendum Note (Signed)
Addended by: Eleonore Chiquito on: 09/08/2021 07:54 AM ? ? Modules accepted: Orders ? ?

## 2021-09-08 NOTE — Telephone Encounter (Signed)
Spoke with the patient, detailed instructions left with the patient. He stated that he would be here for his test. Asked to call back with any questions. S.Elody Kleinsasser EMTP 

## 2021-09-09 ENCOUNTER — Encounter: Payer: Self-pay | Admitting: Cardiology

## 2021-09-13 ENCOUNTER — Ambulatory Visit (INDEPENDENT_AMBULATORY_CARE_PROVIDER_SITE_OTHER): Payer: Commercial Managed Care - PPO

## 2021-09-13 ENCOUNTER — Other Ambulatory Visit: Payer: Self-pay

## 2021-09-13 DIAGNOSIS — R0609 Other forms of dyspnea: Secondary | ICD-10-CM

## 2021-09-13 DIAGNOSIS — I2510 Atherosclerotic heart disease of native coronary artery without angina pectoris: Secondary | ICD-10-CM | POA: Diagnosis not present

## 2021-09-13 LAB — MYOCARDIAL PERFUSION IMAGING
Estimated workload: 10.1
Exercise duration (min): 8 min
Exercise duration (sec): 0 s
LV dias vol: 133 mL (ref 62–150)
LV sys vol: 54 mL
MPHR: 157 {beats}/min
Nuc Stress EF: 59 %
Peak HR: 144 {beats}/min
Percent HR: 91 %
Rest HR: 56 {beats}/min
Rest Nuclear Isotope Dose: 10.6 mCi
SDS: 1
SRS: 1
SSS: 2
Stress Nuclear Isotope Dose: 31.4 mCi
TID: 0.89

## 2021-09-13 MED ORDER — TECHNETIUM TC 99M TETROFOSMIN IV KIT
10.6000 | PACK | Freq: Once | INTRAVENOUS | Status: AC | PRN
  Administered 2021-09-13: 10.6 via INTRAVENOUS

## 2021-09-13 MED ORDER — TECHNETIUM TC 99M TETROFOSMIN IV KIT
31.4000 | PACK | Freq: Once | INTRAVENOUS | Status: AC | PRN
  Administered 2021-09-13: 31.4 via INTRAVENOUS

## 2021-11-06 ENCOUNTER — Other Ambulatory Visit: Payer: Self-pay | Admitting: Cardiology

## 2021-12-06 ENCOUNTER — Other Ambulatory Visit: Payer: Self-pay | Admitting: Cardiology

## 2022-02-07 ENCOUNTER — Other Ambulatory Visit: Payer: Self-pay

## 2022-02-07 MED ORDER — ROSUVASTATIN CALCIUM 20 MG PO TABS: 20.0000 mg | ORAL_TABLET | Freq: Every day | ORAL | 0 refills | Status: DC

## 2022-04-05 ENCOUNTER — Encounter: Payer: Self-pay | Admitting: Cardiology

## 2022-04-05 ENCOUNTER — Ambulatory Visit: Payer: Commercial Managed Care - PPO | Attending: Cardiology | Admitting: Cardiology

## 2022-04-05 VITALS — BP 130/74 | HR 58 | Ht 73.0 in | Wt 259.2 lb

## 2022-04-05 DIAGNOSIS — E782 Mixed hyperlipidemia: Secondary | ICD-10-CM | POA: Diagnosis not present

## 2022-04-05 DIAGNOSIS — Z951 Presence of aortocoronary bypass graft: Secondary | ICD-10-CM | POA: Diagnosis not present

## 2022-04-05 DIAGNOSIS — E669 Obesity, unspecified: Secondary | ICD-10-CM

## 2022-04-05 DIAGNOSIS — I2510 Atherosclerotic heart disease of native coronary artery without angina pectoris: Secondary | ICD-10-CM

## 2022-04-05 DIAGNOSIS — I10 Essential (primary) hypertension: Secondary | ICD-10-CM | POA: Diagnosis not present

## 2022-04-05 DIAGNOSIS — E66811 Obesity, class 1: Secondary | ICD-10-CM

## 2022-04-05 HISTORY — DX: Obesity, unspecified: E66.9

## 2022-04-05 HISTORY — DX: Obesity, class 1: E66.811

## 2022-04-05 NOTE — Progress Notes (Signed)
Cardiology Office Note:    Date:  04/05/2022   ID:  Satira Sark, DOB [birth year], MRN [id number]  PCP:  Philmore Pali, NP (Inactive)  Cardiologist:  Jenean Lindau, MD   Referring MD: Philmore Pali, NP    ASSESSMENT:    No diagnosis found. PLAN:    In order of problems listed above:  Coronary artery disease: Secondary prevention stressed with the patient.  Importance of compliance with diet medication stressed any vocalized understanding.  He was advised to walk at least half an hour a day 5 days a week and he promises to do so. Essential hypertension: Blood pressure is stable and diet was emphasized.  Lifestyle modification urged.  Salt intake issues were discussed. Mixed dyslipidemia: Lipids were reviewed from recent blood work obtained from primary care and I discussed this with him at extensive length. Obesity: Weight reduction was stressed.  He promises to do better.  Risks of obesity explained. Patient will be seen in follow-up appointment in 9 months or earlier if the patient has any concerns    Medication Adjustments/Labs and Tests Ordered: Current medicines are reviewed at length with the patient today.  Concerns regarding medicines are outlined above.  No orders of the defined types were placed in this encounter.  No orders of the defined types were placed in this encounter.    No chief complaint on file.    History of Present Illness:    Dan Nelson is a 66 y.o. male.  Patient has past medical history of coronary artery disease post CABG surgery, essential hypertension, mixed dyslipidemia and obesity.  He denies any problems at this time and takes care of activities of daily living.  No chest pain orthopnea or PND.  Overall he leads a sedentary lifestyle.  At the time of my evaluation, the patient is alert awake oriented and in no distress.  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    Coronary artery disease    Coronary atherosclerosis 04/07/2020   Drug therapy 10/18/2015   Dyslipidemia 10/18/2015   Dyspnea    Essential (primary) hypertension 03/11/2019   GERD (gastroesophageal reflux disease) 10/18/2015   GERD without esophagitis    Hyperlipidemia    Liver enzyme elevation    Lumbar radiculopathy 03/11/2019   Obesity    Postop check 05/12/2020   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/15/2020   Spinal stenosis, lumbar    Statin intolerance    Urinary retention with incomplete bladder emptying 04/22/2020    Past Surgical History:  Procedure Laterality Date   CARDIAC CATHETERIZATION     CORONARY ARTERY BYPASS GRAFT N/A 04/15/2020   Procedure: Coronary artery bypass grafting times four using left internal mammary artery and endoscopically harvested right saphenous vein graft;  Surgeon: Ivin Poot, MD;  Location: [place];  Service: Open Heart Surgery;  Laterality: N/A;   Cosmetic ear surgery bilateral, childhood     LEFT HEART CATH AND CORONARY ANGIOGRAPHY N/A 03/16/2020   Procedure: LEFT HEART CATH AND CORONARY ANGIOGRAPHY;  Surgeon: Belva Crome, MD;  Location: [facility] CV LAB;  Service:  Cardiovascular;  Laterality: N/A;   TEE WITHOUT CARDIOVERSION N/A 04/15/2020   Procedure: TRANSESOPHAGEAL ECHOCARDIOGRAM (TEE);  Surgeon: Prescott Gum, Collier Salina, MD;  Location: [facility];  Service: Open Heart Surgery;  Laterality: N/A;   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   cholecalciferol (VITAMIN D3) 25 MCG (1000 UNIT)  tablet Take 1,000 Units by mouth daily.   finasteride (PROSCAR) 5 MG tablet Take 5 mg by mouth daily.   hydrochlorothiazide (HYDRODIURIL) 25 MG tablet TAKE 1/2 TABLET(12.5 MG) BY MOUTH DAILY   losartan (COZAAR) 100 MG tablet TAKE 1 TABLET(100 MG) BY MOUTH DAILY   nitroGLYCERIN (NITROSTAT) 0.4 MG SL tablet Place 0.4 mg under the tongue every 5 (five) minutes as needed for chest pain.   polycarbophil (FIBERCON) 625 MG tablet Take 625 mg by mouth daily.   rosuvastatin (CRESTOR) 20 MG tablet Take 1 tablet (20 mg total) by mouth daily.   tamsulosin (FLOMAX) 0.4 MG CAPS capsule Take 1 capsule (0.4 mg total) by mouth daily.     Allergies:   Penicillins, Augmentin [amoxicillin-pot clavulanate], and Zocor [simvastatin]   Social History   Socioeconomic History   Marital status: Married    Spouse name: Not on file   Number of children: Not on file   Years of education: Not on file   Highest education level: Not on file  Occupational History   Not on file  Tobacco Use   Smoking status: Every Day    Types: Cigarettes   Smokeless tobacco: Current    Types: Snuff, Chew  Substance and Sexual Activity   Alcohol use: Never   Drug use: Not on file   Sexual activity: Not on file  Other Topics Concern   Not on file  Social History Narrative   Not on file   Social Determinants of Health   Financial Resource Strain: Not on file  Food Insecurity: Not on file  Transportation Needs: Not on file  Physical Activity: Not on file  Stress: Not on file  Social Connections: Not on file     Family History: The patient's family history includes CAD in his father and mother; Heart attack in his father and mother; Hypercholesterolemia in his brother and mother; Stroke in his father.  ROS:   Please see the history of present illness.    All other systems reviewed and are negative.  EKGs/Labs/Other Studies Reviewed:    The following studies were reviewed today: I discussed my findings with the  patient at length   Recent Labs: No results found for requested labs within last 365 days.  Recent Lipid Panel    Component Value Date/Time   CHOL 115 11/18/2020 0947   TRIG 77 11/18/2020 0947   HDL 35 (L) 11/18/2020 0947   CHOLHDL 3.3 11/18/2020 0947   LDLCALC 64 11/18/2020 0947    Physical Exam:    VS:  BP 130/74   Pulse (!) 58   Ht 6\' 1"  (1.854 m)   Wt 259 lb 3.2 oz (117.6 kg)   SpO2 95%   BMI 34.20 kg/m     Wt Readings from Last 3 Encounters:  04/05/22 259 lb 3.2 oz (117.6 kg)  09/13/21 252 lb (114.3 kg)  07/08/21 252 lb (114.3 kg)     GEN: Patient is in no acute distress HEENT: Normal NECK: No JVD; No carotid bruits LYMPHATICS: No lymphadenopathy  CARDIAC: Hear sounds regular, 2/6 systolic murmur at the apex. RESPIRATORY:  Clear to auscultation without rales, wheezing or rhonchi  ABDOMEN: Soft, non-tender, non-distended MUSCULOSKELETAL:  No edema; No deformity  SKIN: Warm and dry NEUROLOGIC:  Alert and oriented x 3 PSYCHIATRIC:  Normal affect   Signed, Jenean Lindau, MD  04/05/2022 9:08 AM    [place]

## 2022-04-05 NOTE — Patient Instructions (Signed)

## 2022-05-05 ENCOUNTER — Other Ambulatory Visit: Payer: Self-pay | Admitting: Cardiology

## 2022-10-30 ENCOUNTER — Other Ambulatory Visit: Payer: Self-pay | Admitting: Cardiology

## 2022-12-28 ENCOUNTER — Other Ambulatory Visit: Payer: Self-pay

## 2023-01-11 ENCOUNTER — Ambulatory Visit: Payer: Commercial Managed Care - PPO | Attending: Cardiology | Admitting: Cardiology

## 2023-01-11 ENCOUNTER — Encounter: Payer: Self-pay | Admitting: Cardiology

## 2023-01-11 VITALS — BP 112/72 | HR 60 | Ht 73.0 in | Wt 250.0 lb

## 2023-01-11 DIAGNOSIS — I10 Essential (primary) hypertension: Secondary | ICD-10-CM

## 2023-01-11 DIAGNOSIS — Z951 Presence of aortocoronary bypass graft: Secondary | ICD-10-CM | POA: Diagnosis not present

## 2023-01-11 DIAGNOSIS — E782 Mixed hyperlipidemia: Secondary | ICD-10-CM

## 2023-01-11 DIAGNOSIS — I2510 Atherosclerotic heart disease of native coronary artery without angina pectoris: Secondary | ICD-10-CM | POA: Diagnosis not present

## 2023-01-11 DIAGNOSIS — E669 Obesity, unspecified: Secondary | ICD-10-CM | POA: Diagnosis not present

## 2023-01-11 NOTE — Patient Instructions (Signed)
Medication Instructions:  Your physician recommends that you continue on your current medications as directed. Please refer to the Current Medication list given to you today.  *If you need a refill on your cardiac medications before your next appointment, please call your pharmacy*   Lab Work: None If you have labs (blood work) drawn today and your tests are completely normal, you will receive your results only by: MyChart Message (if you have MyChart) OR A paper copy in the mail If you have any lab test that is abnormal or we need to change your treatment, we will call you to review the results.   Testing/Procedures: None   Follow-Up: At [facility], you and your health needs are our priority.  As part of our continuing mission to provide you with exceptional heart care, we have created designated Provider Care Teams.  These Care Teams include your primary Cardiologist (physician) and Advanced Practice Providers (APPs -  Physician Assistants and Nurse Practitioners) who all work together to provide you with the care you need, when you need it.  We recommend signing up for the patient portal called "MyChart".  Sign up information is provided on this After Visit Summary.  MyChart is used to connect with patients for Virtual Visits (Telemedicine).  Patients are able to view lab/test results, encounter notes, upcoming appointments, etc.  Non-urgent messages can be sent to your provider as well.   To learn more about what you can do with MyChart, go to [email or web address].    Your next appointment:   1 year(s)  Provider:   Rajan Revankar, MD    Other Instructions None  

## 2023-01-11 NOTE — Progress Notes (Signed)
Cardiology Office Note:    Date:  01/11/2023   ID:  Dan Nelson, DOB [birth year], MRN [id number]  PCP:  Ronal Fear, NP (Inactive)  Cardiologist:  Garwin Brothers, MD   Referring MD: No ref. provider found    ASSESSMENT:    1. Essential (primary) hypertension   2. Coronary artery disease involving native coronary artery of native heart without angina pectoris   3. Obesity (BMI 30.0-34.9)   4. S/P CABG x 4   5. Mixed hyperlipidemia    PLAN:    In order of problems listed above:  Coronary artery disease: Secondary prevention stressed with the patient.  Importance of compliance with diet medication stressed and he vocalized understanding.  He was advised to walk at least half an hour a day 5 days a week and he promises to do so. Essential hypertension: Blood pressure stable and diet was emphasized.  Lifestyle modification urged. Mixed dyslipidemia: On lipid-lowering medications.  Blood work from February was reviewed and discussed with the patient and found to be fine. Obesity: Weight reduction stressed and he promises to do better.  He has done well losing weight in the past several months. Patient will be seen in follow-up appointment in 6 months or earlier if the patient has any concerns.    Medication Adjustments/Labs and Tests Ordered: Current medicines are reviewed at length with the patient today.  Concerns regarding medicines are outlined above.  Orders Placed This Encounter  Procedures   EKG 12-Lead   No orders of the defined types were placed in this encounter.    Chief Complaint  Patient presents with   Follow-up     History of Present Illness:    Dan Nelson is a 67 y.o. male.  Patient has past medical history of coronary artery disease, essential hypertension, mixed dyslipidemia and and obesity.  He overall leads a sedentary lifestyle.  He denies any chest pain orthopnea or PND.  His wife accompanies him for this visit.  He has a full-time job.  At the  time of my evaluation, the patient is alert awake oriented and in no distress.  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    Coronary artery disease    Coronary atherosclerosis 04/07/2020   Drug therapy 10/18/2015   Dyslipidemia 10/18/2015   Dyspnea    Essential (primary) hypertension 03/11/2019   GERD (gastroesophageal reflux disease) 10/18/2015   GERD without esophagitis    Hyperlipidemia    Liver enzyme elevation    Lumbar radiculopathy 03/11/2019   Obesity    Obesity (BMI 30.0-34.9) 04/05/2022   Postop check 05/12/2020   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/15/2020   Spinal stenosis, lumbar    Statin intolerance    Urinary retention with incomplete bladder emptying 04/22/2020    Past Surgical History:  Procedure Laterality Date   CARDIAC CATHETERIZATION     CORONARY ARTERY BYPASS GRAFT N/A 04/15/2020   Procedure: Coronary artery bypass grafting times four using left internal mammary artery and endoscopically harvested right saphenous vein graft;  Surgeon: Kerin Perna, MD;  Location: [facility] OR;  Service: Open  Heart Surgery;  Laterality: N/A;   Cosmetic ear surgery bilateral, childhood     LEFT HEART CATH AND CORONARY ANGIOGRAPHY N/A 03/16/2020   Procedure: LEFT HEART CATH AND CORONARY ANGIOGRAPHY;  Surgeon: Lyn Records, MD;  Location: [facility] INVASIVE CV LAB;  Service: Cardiovascular;  Laterality: N/A;   TEE WITHOUT CARDIOVERSION N/A 04/15/2020   Procedure: TRANSESOPHAGEAL ECHOCARDIOGRAM (TEE);  Surgeon: Donata Clay, Theron Arista, MD;  Location: [facility] OR;  Service: Open Heart  Surgery;  Laterality: N/A;   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   cholecalciferol (VITAMIN D3) 25 MCG (1000 UNIT) tablet Take 1,000 Units by mouth daily.   finasteride (PROSCAR) 5 MG tablet Take 5 mg by mouth daily.   hydrochlorothiazide (HYDRODIURIL) 25 MG tablet Take 0.5 tablets (12.5 mg total) by mouth daily.   losartan (COZAAR) 100 MG tablet Take 1 tablet (100 mg total) by mouth daily.   nitroGLYCERIN (NITROSTAT) 0.4 MG SL tablet Place 0.4 mg under the tongue every 5 (five) minutes as needed for chest pain.   omeprazole (PRILOSEC) 20 MG capsule Take 20 mg by mouth daily as needed (heartburn or indigestion).   polycarbophil (FIBERCON) 625 MG tablet Take 625 mg by mouth daily.   rosuvastatin (CRESTOR) 20 MG tablet Take 1 tablet (20 mg total) by mouth daily.   silodosin (RAPAFLO) 8 MG CAPS capsule Take 8 mg by mouth daily.     Allergies:   Penicillins, Augmentin [amoxicillin-pot clavulanate], and Zocor [simvastatin]   Social History   Socioeconomic History   Marital status: Married    Spouse name: Not on file   Number of children: Not on file   Years of education: Not on file   Highest education level: Not on file  Occupational History   Not on file  Tobacco Use   Smoking status: Never   Smokeless tobacco: Current    Types: Snuff, Chew  Vaping Use   Vaping status: Never Used  Substance and Sexual Activity   Alcohol use: Never   Drug use: Never   Sexual activity: Not on file  Other Topics Concern   Not on file  Social History Narrative   Not on file   Social Determinants of Health   Financial Resource Strain: Not on file  Food Insecurity: Not on file  Transportation Needs: Not on file  Physical Activity: Not on file  Stress: Not on file  Social Connections: Not on file     Family History: The patient's family history includes CAD in his  father and mother; Heart attack in his father and mother; Hypercholesterolemia in his brother and mother; Stroke in his father.  ROS:   Please see the history of present illness.    All other systems reviewed and are negative.  EKGs/Labs/Other Studies Reviewed:    The following studies were reviewed today: EKG reveals sinus rhythm and nonspecific ST-T changes   Recent Labs: No results found for requested labs within last 365 days.  Recent Lipid Panel    Component Value Date/Time   CHOL 115 11/18/2020 0947   TRIG 77 11/18/2020 0947   HDL 35 (L) 11/18/2020 0947   CHOLHDL 3.3 11/18/2020 0947   LDLCALC 64 11/18/2020 0947    Physical Exam:    VS:  BP 112/72 (BP Location: Right Arm, Patient Position: Sitting, Cuff Size: Normal)   Pulse 60  Ht 6\' 1"  (1.854 m)   Wt 250 lb (113.4 kg)   SpO2 94%   BMI 32.98 kg/m     Wt Readings from Last 3 Encounters:  01/11/23 250 lb (113.4 kg)  04/05/22 259 lb 3.2 oz (117.6 kg)  09/13/21 252 lb (114.3 kg)     GEN: Patient is in no acute distress HEENT: Normal NECK: No JVD; No carotid bruits LYMPHATICS: No lymphadenopathy CARDIAC: Hear sounds regular, 2/6 systolic murmur at the apex. RESPIRATORY:  Clear to auscultation without rales, wheezing or rhonchi  ABDOMEN: Soft, non-tender, non-distended MUSCULOSKELETAL:  No edema; No deformity  SKIN: Warm and dry NEUROLOGIC:  Alert and oriented x 3 PSYCHIATRIC:  Normal affect   Signed, Garwin Brothers, MD  01/11/2023 1:28 PM    [facility]

## 2023-01-26 ENCOUNTER — Other Ambulatory Visit: Payer: Self-pay | Admitting: Cardiology

## 2023-01-26 NOTE — Telephone Encounter (Signed)
RX sent

## 2023-07-09 DIAGNOSIS — M9903 Segmental and somatic dysfunction of lumbar region: Secondary | ICD-10-CM | POA: Diagnosis not present

## 2023-07-09 DIAGNOSIS — M9905 Segmental and somatic dysfunction of pelvic region: Secondary | ICD-10-CM | POA: Diagnosis not present

## 2023-07-09 DIAGNOSIS — M5136 Other intervertebral disc degeneration, lumbar region with discogenic back pain only: Secondary | ICD-10-CM | POA: Diagnosis not present

## 2023-07-09 DIAGNOSIS — M9901 Segmental and somatic dysfunction of cervical region: Secondary | ICD-10-CM | POA: Diagnosis not present

## 2023-07-25 ENCOUNTER — Other Ambulatory Visit: Payer: Self-pay | Admitting: Cardiology

## 2023-07-25 NOTE — Telephone Encounter (Signed)
Rx refill sent to pharmacy. 

## 2023-07-26 DIAGNOSIS — R3914 Feeling of incomplete bladder emptying: Secondary | ICD-10-CM | POA: Diagnosis not present

## 2023-07-31 DIAGNOSIS — R3914 Feeling of incomplete bladder emptying: Secondary | ICD-10-CM | POA: Diagnosis not present

## 2023-08-15 DIAGNOSIS — E7800 Pure hypercholesterolemia, unspecified: Secondary | ICD-10-CM | POA: Diagnosis not present

## 2023-08-15 DIAGNOSIS — R059 Cough, unspecified: Secondary | ICD-10-CM | POA: Diagnosis not present

## 2023-08-15 DIAGNOSIS — I10 Essential (primary) hypertension: Secondary | ICD-10-CM | POA: Diagnosis not present

## 2023-08-15 DIAGNOSIS — R509 Fever, unspecified: Secondary | ICD-10-CM | POA: Diagnosis not present

## 2023-08-20 ENCOUNTER — Telehealth: Payer: Self-pay | Admitting: Cardiology

## 2023-08-20 ENCOUNTER — Other Ambulatory Visit: Payer: Self-pay | Admitting: Urology

## 2023-08-20 NOTE — Telephone Encounter (Signed)
   Pre-operative Risk Assessment    Patient Name: Dan Nelson  DOB: Dec 06, 1955 MRN: [id number]      Request for Surgical Clearance    Procedure:   Aqua ablation of the prostate  Date of Surgery:  Clearance 10/09/23                                 Surgeon:  Dr, Mena Goes Surgeon's Group or Practice Name:  [facility]  Phone number:  [phone] Fax number:  [phone]   Type of Clearance Requested:   - Medical  - Pharmacy:  Hold Aspirin 5 Days   Type of Anesthesia:  General    Additional requests/questions:    Nilda Riggs   08/20/2023, 11:48 AM

## 2023-08-22 ENCOUNTER — Telehealth: Payer: Self-pay | Admitting: *Deleted

## 2023-08-22 NOTE — Telephone Encounter (Signed)
   Name: Dan Nelson  DOB: 09/12/1955  MRN: [id number]  Primary Cardiologist: None  Chart reviewed as part of pre-operative protocol coverage. Because of Dan Nelson's past medical history and time since last visit, he will require a follow-up telephone visit in order to better assess preoperative cardiovascular risk.  Pre-op covering staff: - Please schedule appointment and call patient to inform them. If patient already had an upcoming appointment within acceptable timeframe, please add "pre-op clearance" to the appointment notes so provider is aware. - Please contact requesting surgeon's office via preferred method (i.e, phone, fax) to inform them of need for appointment prior to surgery.  If asymptomatic at the time of phone call, can hold aspirin x 5 days prior to procedure and resume when medically safe to do so.  Sharlene Dory, PA-C  08/22/2023, 9:51 AM

## 2023-08-22 NOTE — Telephone Encounter (Signed)
 Pt has been scheduled tele preop appt 09/21/23. Med rec and consent are done.

## 2023-08-22 NOTE — Telephone Encounter (Signed)
Pt has been scheduled tele preop appt 09/21/23. Med rec and consent are done.      Patient Consent for Virtual Visit        Dan Nelson has provided verbal consent on 08/22/2023 for a virtual visit (video or telephone).   CONSENT FOR VIRTUAL VISIT FOR:  Dan Nelson  By participating in this virtual visit I agree to the following:  I hereby voluntarily request, consent and authorize [facility] and its employed or contracted physicians, physician assistants, nurse practitioners or other licensed health care professionals (the Practitioner), to provide me with telemedicine health care services (the "Services") as deemed necessary by the treating Practitioner. I acknowledge and consent to receive the Services by the Practitioner via telemedicine. I understand that the telemedicine visit will involve communicating with the Practitioner through live audiovisual communication technology and the disclosure of certain medical information by electronic transmission. I acknowledge that I have been given the opportunity to request an in-person assessment or other available alternative prior to the telemedicine visit and am voluntarily participating in the telemedicine visit.  I understand that I have the right to withhold or withdraw my consent to the use of telemedicine in the course of my care at any time, without affecting my right to future care or treatment, and that the Practitioner or I may terminate the telemedicine visit at any time. I understand that I have the right to inspect all information obtained and/or recorded in the course of the telemedicine visit and may receive copies of available information for a reasonable fee.  I understand that some of the potential risks of receiving the Services via telemedicine include:  Delay or interruption in medical evaluation due to technological equipment failure or disruption; Information transmitted may not be sufficient (e.g. poor  resolution of images) to allow for appropriate medical decision making by the Practitioner; and/or  In rare instances, security protocols could fail, causing a breach of personal health information.  Furthermore, I acknowledge that it is my responsibility to provide information about my medical history, conditions and care that is complete and accurate to the best of my ability. I acknowledge that Practitioner's advice, recommendations, and/or decision may be based on factors not within their control, such as incomplete or inaccurate data provided by me or distortions of diagnostic images or specimens that may result from electronic transmissions. I understand that the practice of medicine is not an exact science and that Practitioner makes no warranties or guarantees regarding treatment outcomes. I acknowledge that a copy of this consent can be made available to me via my patient portal [facility] MyChart), or I can request a printed copy by calling the office of [facility].    I understand that my insurance will be billed for this visit.   I have read or had this consent read to me. I understand the contents of this consent, which adequately explains the benefits and risks of the Services being provided via telemedicine.  I have been provided ample opportunity to ask questions regarding this consent and the Services and have had my questions answered to my satisfaction. I give my informed consent for the services to be provided through the use of telemedicine in my medical care

## 2023-08-24 DIAGNOSIS — I10 Essential (primary) hypertension: Secondary | ICD-10-CM | POA: Diagnosis not present

## 2023-08-24 DIAGNOSIS — E785 Hyperlipidemia, unspecified: Secondary | ICD-10-CM | POA: Diagnosis not present

## 2023-08-24 DIAGNOSIS — I2510 Atherosclerotic heart disease of native coronary artery without angina pectoris: Secondary | ICD-10-CM | POA: Diagnosis not present

## 2023-08-24 DIAGNOSIS — K219 Gastro-esophageal reflux disease without esophagitis: Secondary | ICD-10-CM | POA: Diagnosis not present

## 2023-08-24 DIAGNOSIS — Z79899 Other long term (current) drug therapy: Secondary | ICD-10-CM | POA: Diagnosis not present

## 2023-08-24 DIAGNOSIS — Z9181 History of falling: Secondary | ICD-10-CM | POA: Diagnosis not present

## 2023-08-24 DIAGNOSIS — R062 Wheezing: Secondary | ICD-10-CM | POA: Diagnosis not present

## 2023-08-24 DIAGNOSIS — N138 Other obstructive and reflux uropathy: Secondary | ICD-10-CM | POA: Diagnosis not present

## 2023-09-05 DIAGNOSIS — M9903 Segmental and somatic dysfunction of lumbar region: Secondary | ICD-10-CM | POA: Diagnosis not present

## 2023-09-05 DIAGNOSIS — M5136 Other intervertebral disc degeneration, lumbar region with discogenic back pain only: Secondary | ICD-10-CM | POA: Diagnosis not present

## 2023-09-05 DIAGNOSIS — M9905 Segmental and somatic dysfunction of pelvic region: Secondary | ICD-10-CM | POA: Diagnosis not present

## 2023-09-05 DIAGNOSIS — M9901 Segmental and somatic dysfunction of cervical region: Secondary | ICD-10-CM | POA: Diagnosis not present

## 2023-09-21 ENCOUNTER — Ambulatory Visit: Payer: Commercial Managed Care - PPO | Attending: Cardiology

## 2023-09-21 DIAGNOSIS — Z01810 Encounter for preprocedural cardiovascular examination: Secondary | ICD-10-CM | POA: Diagnosis not present

## 2023-09-21 NOTE — Progress Notes (Signed)
 Virtual Visit via Telephone Note   Because of [name] co-morbid illnesses, he is at least at moderate risk for complications without adequate follow up.  This format is felt to be most appropriate for this patient at this time.  Due to technical limitations with video connection (technology), today's appointment will be conducted as an audio only telehealth visit, and [name] verbally agreed to proceed in this manner.   All issues noted in this document were discussed and addressed.  No physical exam could be performed with this format.  Evaluation Performed:  Preoperative cardiovascular risk assessment _____________   Date:  09/21/2023   Patient ID:  Dan Nelson, DOB [birth year], MRN [id number] Patient Location:  Home Provider location:   Office  Primary Care Provider:  Ronal Fear, NP (Inactive) Primary Cardiologist:  None  Chief Complaint / Patient Profile  68 y.o. y/o male with a h/o CAD s/p CABG x 4 in 2021, hypertension, hyperlipidemia who is pending aqua ablation of the prostate with Dr. Mena Goes and presents today for telephonic preoperative cardiovascular risk assessment. History of Present Illness  Dan Nelson is a 68 y.o. male who presents via audio/video conferencing for a telehealth visit today.  Pt was last seen in cardiology clinic on 01/11/2023 by Dr. Tomie China.  At that time Osmel Dykstra was doing well.  The patient is now pending procedure as outlined above. Since his last visit, he has remained stable from a cardiac standpoint. Today he denies chest pain, shortness of breath, lower extremity edema, fatigue, palpitations, melena, hematuria, hemoptysis, diaphoresis, weakness, presyncope, syncope, orthopnea, and PND.  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    Coronary artery disease    Coronary atherosclerosis 04/07/2020   Drug therapy 10/18/2015   Dyslipidemia 10/18/2015   Dyspnea    Essential (primary) hypertension 03/11/2019   GERD (gastroesophageal reflux disease) 10/18/2015   GERD without esophagitis    Hyperlipidemia    Liver enzyme elevation    Lumbar radiculopathy 03/11/2019   Obesity    Obesity (BMI 30.0-34.9) 04/05/2022   Postop check 05/12/2020   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/15/2020   Spinal stenosis, lumbar    Statin intolerance    Urinary retention with incomplete bladder emptying 04/22/2020   Past Surgical History:  Procedure Laterality Date   CARDIAC CATHETERIZATION     CORONARY ARTERY BYPASS GRAFT N/A 04/15/2020   Procedure: Coronary artery bypass grafting times four using left internal mammary artery and endoscopically harvested right saphenous vein graft;  Surgeon: Kerin Perna, MD;  Location: [facility] OR;  Service: Open Heart Surgery;  Laterality: N/A;   Cosmetic ear surgery bilateral, childhood     LEFT HEART CATH AND CORONARY ANGIOGRAPHY N/A 03/16/2020   Procedure: LEFT HEART CATH AND CORONARY ANGIOGRAPHY;  Surgeon: Lyn Records, MD;  Location: [facility] INVASIVE CV LAB;  Service: Cardiovascular;  Laterality: N/A;   TEE WITHOUT CARDIOVERSION N/A 04/15/2020   Procedure: TRANSESOPHAGEAL ECHOCARDIOGRAM (TEE);  Surgeon: Donata Clay, Theron Arista, MD;  Location: [facility] OR;  Service: Open Heart Surgery;  Laterality: N/A;  traumatic amputation left 3rd finger distal phalanx     Allergies Allergies  Allergen Reactions   Penicillins Swelling and Rash        Augmentin [Amoxicillin-Pot Clavulanate] Swelling   Zocor [Simvastatin] Swelling    Hands swell   Home Medications    Prior  to Admission medications   Medication Sig Start Date End Date Taking? Authorizing Provider  aspirin 81 MG EC tablet Take 81 mg by mouth daily.    [provider]  cholecalciferol (VITAMIN D3) 25 MCG (1000 UNIT) tablet Take 1,000 Units by mouth daily.    [provider]  finasteride (PROSCAR) 5 MG tablet Take 5 mg by mouth daily. 05/31/21   [provider]  hydrochlorothiazide (HYDRODIURIL) 25 MG tablet Take 0.5 tablets (12.5 mg total) by mouth daily. 07/25/23   Revankar, Aundra Dubin, MD  losartan (COZAAR) 100 MG tablet Take 1 tablet (100 mg total) by mouth daily. 07/25/23   Revankar, Aundra Dubin, MD  nitroGLYCERIN (NITROSTAT) 0.4 MG SL tablet Place 0.4 mg under the tongue every 5 (five) minutes as needed for chest pain.    [provider]  omeprazole (PRILOSEC) 20 MG capsule Take 20 mg by mouth daily as needed (heartburn or indigestion). 08/18/22   [provider]  polycarbophil (FIBERCON) 625 MG tablet Take 625 mg by mouth daily.    [provider]  rosuvastatin (CRESTOR) 20 MG tablet TAKE 1 TABLET(20 MG) BY MOUTH DAILY 01/26/23   Revankar, Aundra Dubin, MD  silodosin (RAPAFLO) 8 MG CAPS capsule Take 8 mg by mouth daily. 10/12/22   [provider]   Physical Exam   Vital Signs:  Dan Nelson does not have vital signs available for review today. Given telephonic nature of communication, physical exam is limited. AAOx3. NAD. Normal affect.  Speech and respirations are unlabored. Accessory Clinical Findings  None Assessment & Plan    1.  Preoperative Cardiovascular Risk Assessment: Aqua ablation of the prostate with Dr. Mena Goes Mr. Kaus's perioperative risk of a major cardiac event is 0.9% according to the Revised Cardiac Risk Index (RCRI).  Therefore, he is at low risk for perioperative complications.   His functional capacity is good at 7.99 METs according to the Duke Activity Status Index (DASI). Recommendations: According to ACC/AHA  guidelines, no further cardiovascular testing needed.  The patient may proceed to surgery at acceptable risk.   Antiplatelet and/or Anticoagulation Recommendations: Regarding ASA therapy, we recommend continuation of ASA throughout the perioperative period.  However, if the surgeon feels that cessation of ASA is required in the perioperative period, it may be stopped 5 days prior to surgery with a plan to resume it as soon as felt to be feasible from a surgical standpoint in the post-operative period.   The patient was advised that if he develops new symptoms prior to surgery to contact our office to arrange for a follow-up visit, and he verbalized understanding.  A copy of this note will be routed to requesting surgeon.  Time:   Today, I have spent 7 minutes with the patient with telehealth technology discussing medical history, symptoms, and management plan.    Rip Harbour, NP  09/21/2023, 3:30 PM

## 2023-10-01 NOTE — Patient Instructions (Signed)
 DUE TO COVID-19 ONLY TWO VISITORS  (aged 66 and older)  ARE ALLOWED TO COME WITH YOU AND STAY IN THE WAITING ROOM ONLY DURING PRE OP AND PROCEDURE.   **NO VISITORS ARE ALLOWED IN THE SHORT STAY AREA OR RECOVERY ROOM!!**  IF YOU WILL BE ADMITTED INTO THE HOSPITAL YOU ARE ALLOWED ONLY FOUR SUPPORT PEOPLE DURING VISITATION HOURS ONLY (7 AM -8PM)   The support person(s) must pass our screening, gel in and out, and wear a mask at all times, including in the patient's room. Patients must also wear a mask when staff or their support person are in the room. Visitors GUEST BADGE MUST BE WORN VISIBLY  One adult visitor may remain with you overnight and MUST be in the room by 8 P.M.     Your procedure is scheduled on: 10/09/23   Report to [facility] Main Entrance    Report to admitting at : 9:00 AM   Call this number if you have problems the morning of surgery [phone]   Clear liquids starting day before surgery until : 8:00 AM DAY OF SURGERY  Water Black Coffee (sugar ok, NO MILK/CREAM OR CREAMERS)  Tea (sugar ok, NO MILK/CREAM OR CREAMERS) regular and decaf                             Plain Jell-O (NO RED)                                           Fruit ices (not with fruit pulp, NO RED)                                     Popsicles (NO RED)                                                                  Juice: apple, WHITE grape, WHITE cranberry Sports drinks like Gatorade (NO RED)              FOLLOW BOWEL PREP AND ANY ADDITIONAL PRE OP INSTRUCTIONS YOU RECEIVED FROM YOUR SURGEON'S OFFICE!!!   Oral Hygiene is also important to reduce your risk of infection.                                    Remember - BRUSH YOUR TEETH THE MORNING OF SURGERY WITH YOUR REGULAR TOOTHPASTE  DENTURES WILL BE REMOVED PRIOR TO SURGERY PLEASE DO NOT APPLY "Poly grip" OR ADHESIVES!!!   Do NOT smoke after Midnight   Take these medicines the morning of surgery with A SIP OF WATER:  finasteride,omeprazole.                              You may not have any metal on your body including hair pins, jewelry, and body piercing             Do not wear lotions, powders,  perfumes/cologne, or deodorant              Men may shave face and neck.   Do not bring valuables to the hospital. [facility] IS NOT             RESPONSIBLE   FOR VALUABLES.   Contacts, glasses, or bridgework may not be worn into surgery.   Bring small overnight bag day of surgery.   DO NOT BRING YOUR HOME MEDICATIONS TO THE HOSPITAL. PHARMACY WILL DISPENSE MEDICATIONS LISTED ON YOUR MEDICATION LIST TO YOU DURING YOUR ADMISSION IN THE HOSPITAL!    Patients discharged on the day of surgery will not be allowed to drive home.  Someone NEEDS to stay with you for the first 24 hours after anesthesia.   Special Instructions: Bring a copy of your healthcare power of attorney and living will documents         the day of surgery if you haven't scanned them before.              Please read over the following fact sheets you were given: IF YOU HAVE QUESTIONS ABOUT YOUR PRE-OP INSTRUCTIONS PLEASE CALL [phone]    [facility] - Preparing for Surgery Before surgery, you can play an important role.  Because skin is not sterile, your skin needs to be as free of germs as possible.  You can reduce the number of germs on your skin by washing with CHG (chlorahexidine gluconate) soap before surgery.  CHG is an antiseptic cleaner which kills germs and bonds with the skin to continue killing germs even after washing. Please DO NOT use if you have an allergy to CHG or antibacterial soaps.  If your skin becomes reddened/irritated stop using the CHG and inform your nurse when you arrive at Short Stay. Do not shave (including legs and underarms) for at least 48 hours prior to the first CHG shower.  You may shave your face/neck. Please follow these instructions carefully:  1.  Shower with CHG Soap the night before surgery and the   morning of Surgery.  2.  If you choose to wash your hair, wash your hair first as usual with your  normal  shampoo.  3.  After you shampoo, rinse your hair and body thoroughly to remove the  shampoo.                           4.  Use CHG as you would any other liquid soap.  You can apply chg directly  to the skin and wash                       Gently with a scrungie or clean washcloth.  5.  Apply the CHG Soap to your body ONLY FROM THE NECK DOWN.   Do not use on face/ open                           Wound or open sores. Avoid contact with eyes, ears mouth and genitals (private parts).                       Wash face,  Genitals (private parts) with your normal soap.             6.  Wash thoroughly, paying special attention to the area where your surgery  will be performed.  7.  Thoroughly rinse your body with warm water from the neck down.  8.  DO NOT shower/wash with your normal soap after using and rinsing off  the CHG Soap.                9.  Pat yourself dry with a clean towel.            10.  Wear clean pajamas.            11.  Place clean sheets on your bed the night of your first shower and do not  sleep with pets. Day of Surgery : Do not apply any lotions/deodorants the morning of surgery.  Please wear clean clothes to the hospital/surgery center.  FAILURE TO FOLLOW THESE INSTRUCTIONS MAY RESULT IN THE CANCELLATION OF YOUR SURGERY PATIENT SIGNATURE_________________________________  NURSE SIGNATURE__________________________________  ________________________________________________________________________

## 2023-10-02 ENCOUNTER — Encounter (HOSPITAL_COMMUNITY): Payer: Self-pay

## 2023-10-02 ENCOUNTER — Encounter (HOSPITAL_COMMUNITY)
Admission: RE | Admit: 2023-10-02 | Discharge: 2023-10-02 | Disposition: A | Discharge status: Home / Self Care | Source: Ambulatory Visit | Attending: Urology | Admitting: Urology

## 2023-10-02 ENCOUNTER — Other Ambulatory Visit: Payer: Self-pay

## 2023-10-02 VITALS — BP 145/78 | HR 68 | Temp 98.7°F | Ht 73.0 in | Wt 250.0 lb

## 2023-10-02 DIAGNOSIS — I4891 Unspecified atrial fibrillation: Secondary | ICD-10-CM | POA: Diagnosis not present

## 2023-10-02 DIAGNOSIS — E669 Obesity, unspecified: Secondary | ICD-10-CM | POA: Diagnosis not present

## 2023-10-02 DIAGNOSIS — K219 Gastro-esophageal reflux disease without esophagitis: Secondary | ICD-10-CM | POA: Diagnosis not present

## 2023-10-02 DIAGNOSIS — Z951 Presence of aortocoronary bypass graft: Secondary | ICD-10-CM | POA: Diagnosis not present

## 2023-10-02 DIAGNOSIS — I2510 Atherosclerotic heart disease of native coronary artery without angina pectoris: Secondary | ICD-10-CM | POA: Insufficient documentation

## 2023-10-02 DIAGNOSIS — I10 Essential (primary) hypertension: Secondary | ICD-10-CM | POA: Insufficient documentation

## 2023-10-02 DIAGNOSIS — Z01812 Encounter for preprocedural laboratory examination: Secondary | ICD-10-CM | POA: Diagnosis not present

## 2023-10-02 DIAGNOSIS — Z7982 Long term (current) use of aspirin: Secondary | ICD-10-CM | POA: Insufficient documentation

## 2023-10-02 LAB — BASIC METABOLIC PANEL WITH GFR
Anion gap: 6 (ref 5–15)
BUN: 17 mg/dL (ref 8–23)
CO2: 24 mmol/L (ref 22–32)
Calcium: 8.8 mg/dL — ABNORMAL LOW (ref 8.9–10.3)
Chloride: 106 mmol/L (ref 98–111)
Creatinine, Ser: 1.06 mg/dL (ref 0.61–1.24)
GFR, Estimated: >60 mL/min (ref >60)
Glucose, Bld: 90 mg/dL (ref 70–99)
Potassium: 3.7 mmol/L (ref 3.5–5.1)
Sodium: 136 mmol/L (ref 135–145)

## 2023-10-02 LAB — CBC
HCT: 46.4 % (ref 39.0–52.0)
Hemoglobin: 15.5 g/dL (ref 13.0–17.0)
MCH: 33.1 pg (ref 26.0–34.0)
MCHC: 33.4 g/dL (ref 30.0–36.0)
MCV: 99.1 fL (ref 80.0–100.0)
Platelets: 220 10*3/uL (ref 150–400)
RBC: 4.68 MIL/uL (ref 4.22–5.81)
RDW: 12.3 % (ref 11.5–15.5)
WBC: 9.7 10*3/uL (ref 4.0–10.5)
nRBC: 0 % (ref 0.0–0.2)

## 2023-10-02 NOTE — Progress Notes (Signed)
 For Anesthesia: PCP -  Lonie Peak: PA Cardiologist - Revankar, Aundra Dubin, MD . Theron Arista: 01/11/23 Clearance:West, Brent General, NP : 09/21/23 Bowel Prep reminder: Reviewed.  Chest x-ray -  EKG - 01/11/23 Stress Test -  ECHO - 04/15/20 Cardiac Cath - 03/16/20 Pacemaker/ICD device last checked: Pacemaker orders received: Device Rep notified:  Spinal Cord Stimulator:N/A  Sleep Study - N/A CPAP -   Fasting Blood Sugar - N/A Checks Blood Sugar _____ times a day Date and result of last Hgb A1c-  Last dose of GLP1 agonist- N/A GLP1 instructions:   Last dose of SGLT-2 inhibitors- N/A SGLT-2 instructions:   Blood Thinner Instructions: Aspirin Instructions: To hold it after: 10/03/23 Last Dose:  Activity level: Can go up a flight of stairs and activities of daily living without stopping and without chest pain and/or shortness of breath   Able to exercise without chest pain and/or shortness of breath  Anesthesia review: Hx: Afib,CAD,CABG,Murmur,HTN,  Patient denies shortness of breath, fever, cough and chest pain at PAT appointment   Patient verbalized understanding of instructions that were given to them at the PAT appointment. Patient was also instructed that they will need to review over the PAT instructions again at home before surgery.

## 2023-10-03 ENCOUNTER — Encounter (HOSPITAL_COMMUNITY)

## 2023-10-03 ENCOUNTER — Encounter (HOSPITAL_COMMUNITY): Payer: Self-pay

## 2023-10-03 NOTE — Anesthesia Preprocedure Evaluation (Addendum)
 Anesthesia Evaluation  Patient identified by MRN, date of birth, ID band Patient awake    Reviewed: Allergy & Precautions, NPO status , Patient's Chart, lab work & pertinent test results  Airway Mallampati: III  TM Distance: >3 FB Neck ROM: Full    Dental  (+) Edentulous Upper, Poor Dentition   Pulmonary neg pulmonary ROS   Pulmonary exam normal breath sounds clear to auscultation       Cardiovascular hypertension (142/80 preop), Pt. on medications + CAD and + CABG (CABG x 4 2021)  Normal cardiovascular exam Rhythm:Regular Rate:Normal  Stress test 09/13/2021:     The study is normal. The study is low risk.   Left ventricular function is normal. Nuclear stress EF: 59 %. The left ventricular ejection fraction is normal (55-65%). End diastolic cavity size is normal.   Good exercise capacity   Echo 03/24/2020:   IMPRESSIONS      1. Left ventricular ejection fraction, by estimation, is 55 to 60%. The left ventricle has normal function. The left ventricle has no regional wall motion abnormalities. There is severe concentric left ventricular hypertrophy. Left ventricular diastolic  parameters are consistent with Grade I diastolic dysfunction (impaired relaxation).  2. Right ventricular systolic function is normal. The right ventricular size is normal. There is normal pulmonary artery systolic pressure.  3. The mitral valve is normal in structure. No evidence of mitral valve regurgitation. No evidence of mitral stenosis.  4. The aortic valve is normal in structure. There is mild calcification of the aortic valve. Aortic valve regurgitation is not visualized. Mild aortic valve sclerosis is present, with no evidence of aortic valve stenosis.  5. There is mild dilatation of the ascending aorta, measuring 38 mm.  6. The inferior vena cava is normal in size with greater than 50% respiratory variability, suggesting right atrial  pressure of 3 mmHg.      Neuro/Psych negative neurological ROS  negative psych ROS   GI/Hepatic Neg liver ROS,GERD  Medicated and Controlled,,  Endo/Other  BMI 33  Renal/GU negative Renal ROS  negative genitourinary   Musculoskeletal negative musculoskeletal ROS (+)    Abdominal   Peds  Hematology negative hematology ROS (+)   Anesthesia Other Findings   Reproductive/Obstetrics negative OB ROS                             Anesthesia Physical Anesthesia Plan  ASA: 3  Anesthesia Plan: General   Post-op Pain Management: Tylenol PO (pre-op)*   Induction: Intravenous  PONV Risk Score and Plan: 2 and Ondansetron, Dexamethasone, Midazolam and Treatment may vary due to age or medical condition  Airway Management Planned: LMA  Additional Equipment: None  Intra-op Plan:   Post-operative Plan: Extubation in OR  Informed Consent: I have reviewed the patients History and Physical, chart, labs and discussed the procedure including the risks, benefits and alternatives for the proposed anesthesia with the patient or authorized representative who has indicated his/her understanding and acceptance.     Dental advisory given  Plan Discussed with: CRNA  Anesthesia Plan Comments:         Anesthesia Quick Evaluation

## 2023-10-03 NOTE — Progress Notes (Signed)
 Case: [id number] Date/Time: 10/09/23 1100   Procedure: ABLATION, PROSTATE, TRANSURETHRAL, USING WATERJET   Anesthesia type: General   Pre-op diagnosis: BENIGN PROSTATIC HYPERPLASIA   [facility] ROOM 03 / [facility]   Surgeons: Jerilee Field, MD       DISCUSSION: Dan Nelson is a 68 yo male who presents to PAT prior to surgery above. PMH of HTN, CAD s/p CABG x 4 (2021), GERD, obesity  Patient follows with Cardiology due to hx of CAD s/p CABG in 2021. Of note patient has diagnosis of A.fib with appears to have just occurred post op after his CABG. He is not anticoagulated. Last seen in clinic on 01/11/23 by Dr. Tomie China. Patient with no cardiac complaints. He was cleared for surgery in televisit on 09/21/23:   "Preoperative Cardiovascular Risk Assessment: Aqua ablation of the prostate with Dr. Mena Goes Mr. Alonge's perioperative risk of a major cardiac event is 0.9% according to the Revised Cardiac Risk Index (RCRI).  Therefore, he is at low risk for perioperative complications.   His functional capacity is good at 7.99 METs according to the Duke Activity Status Index (DASI). Recommendations: According to ACC/AHA guidelines, no further cardiovascular testing needed.  The patient may proceed to surgery at acceptable risk.   Antiplatelet and/or Anticoagulation Recommendations: Regarding ASA therapy, we recommend continuation of ASA throughout the perioperative period.  However, if the surgeon feels that cessation of ASA is required in the perioperative period, it may be stopped 5 days prior to surgery with a plan to resume it as soon as felt to be feasible from a surgical standpoint in the post-operative period."   VS: BP (!) 145/78   Pulse 68   Temp 37.1 C (Oral)   Ht 6\' 1"  (1.854 m)   Wt 113.4 kg   SpO2 96%   BMI 32.98 kg/m   PROVIDERS: PCP -  Lonie Peak: PA Cardiologist - Revankar, Aundra Dubin, MD  LABS: Labs reviewed: Acceptable for surgery. (all labs ordered are listed, but  only abnormal results are displayed)  Labs Reviewed  BASIC METABOLIC PANEL WITH GFR - Abnormal; Notable for the following components:      Result Value   Calcium 8.8 (*)    All other components within normal limits  CBC     IMAGES:   EKG:   CV:  Stress test 09/13/2021:    The study is normal. The study is low risk.   Left ventricular function is normal. Nuclear stress EF: 59 %. The left ventricular ejection fraction is normal (55-65%). End diastolic cavity size is normal.   Good exercise capacity  Echo 03/24/2020:  IMPRESSIONS    1. Left ventricular ejection fraction, by estimation, is 55 to 60%. The left ventricle has normal function. The left ventricle has no regional wall motion abnormalities. There is severe concentric left ventricular hypertrophy. Left ventricular diastolic  parameters are consistent with Grade I diastolic dysfunction (impaired relaxation).  2. Right ventricular systolic function is normal. The right ventricular size is normal. There is normal pulmonary artery systolic pressure.  3. The mitral valve is normal in structure. No evidence of mitral valve regurgitation. No evidence of mitral stenosis.  4. The aortic valve is normal in structure. There is mild calcification of the aortic valve. Aortic valve regurgitation is not visualized. Mild aortic valve sclerosis is present, with no evidence of aortic valve stenosis.  5. There is mild dilatation of the ascending aorta, measuring 38 mm.  6. The inferior vena cava is normal in size  with greater than 50% respiratory variability, suggesting right atrial pressure of 3 mmHg.  Comparison(s): No prior Echocardiogram. 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    Coronary artery disease    Coronary atherosclerosis 04/07/2020   Drug therapy 10/18/2015   Dyslipidemia 10/18/2015   Dyspnea    Essential (primary) hypertension 03/11/2019   GERD (gastroesophageal reflux disease) 10/18/2015   GERD without esophagitis    Hyperlipidemia    Liver enzyme elevation    Lumbar radiculopathy 03/11/2019   Obesity    Obesity (BMI 30.0-34.9) 04/05/2022   Postop check 05/12/2020   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/15/2020   Spinal stenosis, lumbar    Statin intolerance    Urinary retention with incomplete bladder emptying 04/22/2020    Past Surgical History:  Procedure Laterality Date   CARDIAC CATHETERIZATION     CORONARY ARTERY BYPASS GRAFT N/A 04/15/2020   Procedure: Coronary artery bypass grafting times four using left internal mammary artery and endoscopically harvested right saphenous vein graft;  Surgeon: Kerin Perna, MD;  Location: [facility] OR;  Service: Open Heart Surgery;  Laterality: N/A;   Cosmetic ear surgery bilateral, childhood     LEFT HEART CATH AND CORONARY ANGIOGRAPHY N/A 03/16/2020   Procedure: LEFT HEART CATH AND CORONARY ANGIOGRAPHY;  Surgeon: Lyn Records, MD;  Location: [facility] INVASIVE CV LAB;  Service: Cardiovascular;  Laterality: N/A;   TEE WITHOUT CARDIOVERSION N/A 04/15/2020   Procedure: TRANSESOPHAGEAL ECHOCARDIOGRAM (TEE);  Surgeon: Donata Clay, Theron Arista, MD;  Location: [facility] OR;  Service: Open Heart Surgery;  Laterality: N/A;   traumatic amputation left 3rd finger distal phalanx      MEDICATIONS:  albuterol (VENTOLIN HFA) 108 (90 Base) MCG/ACT inhaler   aspirin 81 MG EC tablet   cholecalciferol (VITAMIN D3) 25 MCG (1000 UNIT) tablet   FIBER PO   finasteride (PROSCAR) 5 MG tablet   hydrochlorothiazide (HYDRODIURIL) 25 MG tablet   losartan (COZAAR) 100 MG tablet    nitroGLYCERIN (NITROSTAT) 0.4 MG SL tablet   omeprazole (PRILOSEC) 20 MG capsule   rosuvastatin (CRESTOR) 20 MG tablet   tamsulosin (FLOMAX) 0.4 MG CAPS capsule   No current facility-administered medications for this encounter.   Marcille Blanco [facility] Short Stay/Anesthesiology [facility] Phone [phone] 10/03/2023 2:55 PM

## 2023-10-09 ENCOUNTER — Ambulatory Visit (HOSPITAL_COMMUNITY): Payer: Self-pay | Admitting: Medical

## 2023-10-09 ENCOUNTER — Ambulatory Visit (HOSPITAL_COMMUNITY)
Admission: RE | Admit: 2023-10-09 | Discharge: 2023-10-09 | Disposition: A | Discharge status: Home / Self Care | Payer: Commercial Managed Care - PPO | Source: Ambulatory Visit | Attending: Urology | Admitting: Urology

## 2023-10-09 ENCOUNTER — Ambulatory Visit (HOSPITAL_BASED_OUTPATIENT_CLINIC_OR_DEPARTMENT_OTHER): Admitting: Anesthesiology

## 2023-10-09 ENCOUNTER — Encounter (HOSPITAL_COMMUNITY): Payer: Self-pay | Admitting: Urology

## 2023-10-09 ENCOUNTER — Encounter (HOSPITAL_COMMUNITY)
Admission: RE | Disposition: A | Discharge status: Home / Self Care | Payer: Self-pay | Source: Ambulatory Visit | Attending: Urology

## 2023-10-09 DIAGNOSIS — I10 Essential (primary) hypertension: Secondary | ICD-10-CM | POA: Diagnosis not present

## 2023-10-09 DIAGNOSIS — R350 Frequency of micturition: Secondary | ICD-10-CM | POA: Insufficient documentation

## 2023-10-09 DIAGNOSIS — Z79899 Other long term (current) drug therapy: Secondary | ICD-10-CM | POA: Insufficient documentation

## 2023-10-09 DIAGNOSIS — Z951 Presence of aortocoronary bypass graft: Secondary | ICD-10-CM | POA: Diagnosis not present

## 2023-10-09 DIAGNOSIS — N401 Enlarged prostate with lower urinary tract symptoms: Secondary | ICD-10-CM | POA: Diagnosis present

## 2023-10-09 DIAGNOSIS — I77819 Aortic ectasia, unspecified site: Secondary | ICD-10-CM | POA: Diagnosis not present

## 2023-10-09 DIAGNOSIS — I119 Hypertensive heart disease without heart failure: Secondary | ICD-10-CM | POA: Diagnosis not present

## 2023-10-09 DIAGNOSIS — N138 Other obstructive and reflux uropathy: Secondary | ICD-10-CM

## 2023-10-09 DIAGNOSIS — K219 Gastro-esophageal reflux disease without esophagitis: Secondary | ICD-10-CM | POA: Insufficient documentation

## 2023-10-09 DIAGNOSIS — R3912 Poor urinary stream: Secondary | ICD-10-CM | POA: Insufficient documentation

## 2023-10-09 DIAGNOSIS — Z8249 Family history of ischemic heart disease and other diseases of the circulatory system: Secondary | ICD-10-CM | POA: Diagnosis not present

## 2023-10-09 DIAGNOSIS — I2510 Atherosclerotic heart disease of native coronary artery without angina pectoris: Secondary | ICD-10-CM | POA: Diagnosis not present

## 2023-10-09 DIAGNOSIS — I358 Other nonrheumatic aortic valve disorders: Secondary | ICD-10-CM | POA: Diagnosis not present

## 2023-10-09 SURGERY — ABLATION, PROSTATE, TRANSURETHRAL, USING WATERJET
Anesthesia: General

## 2023-10-09 MED ORDER — ONDANSETRON HCL 4 MG/2ML IJ SOLN: 4.0000 mg | Freq: Once | INTRAMUSCULAR | Status: DC | PRN

## 2023-10-09 MED ORDER — LIDOCAINE HCL (CARDIAC) PF 100 MG/5ML IV SOSY
PREFILLED_SYRINGE | INTRAVENOUS | Status: DC | PRN
  Administered 2023-10-09: 60 mg via INTRATRACHEAL

## 2023-10-09 MED ORDER — FLEET ENEMA RE ENEM: 1.0000 | ENEMA | Freq: Once | RECTAL | Status: DC

## 2023-10-09 MED ORDER — OXYCODONE HCL 5 MG/5ML PO SOLN: 5.0000 mg | Freq: Once | ORAL | Status: DC | PRN

## 2023-10-09 MED ORDER — SODIUM CHLORIDE 0.9 % IR SOLN
Status: DC | PRN
  Administered 2023-10-09: 9000 mL

## 2023-10-09 MED ORDER — CHLORHEXIDINE GLUCONATE 0.12 % MT SOLN
15.0000 mL | Freq: Once | OROMUCOSAL | Status: AC
  Administered 2023-10-09: 15 mL via OROMUCOSAL

## 2023-10-09 MED ORDER — SUGAMMADEX SODIUM 200 MG/2ML IV SOLN
INTRAVENOUS | Status: DC | PRN
  Administered 2023-10-09: 200 mg via INTRAVENOUS

## 2023-10-09 MED ORDER — ONDANSETRON HCL 4 MG/2ML IJ SOLN
INTRAMUSCULAR | Status: DC | PRN
  Administered 2023-10-09: 4 mg via INTRAVENOUS

## 2023-10-09 MED ORDER — ASPIRIN 81 MG PO TBEC: 81.0000 mg | DELAYED_RELEASE_TABLET | Freq: Every day | ORAL | Status: AC

## 2023-10-09 MED ORDER — MIDAZOLAM HCL 2 MG/2ML IJ SOLN
INTRAMUSCULAR | Status: AC
  Filled 2023-10-09: qty 2

## 2023-10-09 MED ORDER — LIDOCAINE HCL (PF) 2 % IJ SOLN
INTRAMUSCULAR | Status: AC
  Filled 2023-10-09: qty 5

## 2023-10-09 MED ORDER — ROCURONIUM BROMIDE 10 MG/ML (PF) SYRINGE
PREFILLED_SYRINGE | INTRAVENOUS | Status: AC
  Filled 2023-10-09: qty 10

## 2023-10-09 MED ORDER — FENTANYL CITRATE (PF) 100 MCG/2ML IJ SOLN
INTRAMUSCULAR | Status: DC | PRN
  Administered 2023-10-09 (×2): 50 ug via INTRAVENOUS

## 2023-10-09 MED ORDER — LEVOFLOXACIN IN D5W 750 MG/150ML IV SOLN
750.0000 mg | INTRAVENOUS | Status: AC
  Administered 2023-10-09: 750 mg via INTRAVENOUS
  Filled 2023-10-09: qty 150

## 2023-10-09 MED ORDER — ORAL CARE MOUTH RINSE: 15.0000 mL | Freq: Once | OROMUCOSAL | Status: AC

## 2023-10-09 MED ORDER — ONDANSETRON HCL 4 MG/2ML IJ SOLN
INTRAMUSCULAR | Status: AC
  Filled 2023-10-09: qty 2

## 2023-10-09 MED ORDER — ROCURONIUM BROMIDE 10 MG/ML (PF) SYRINGE
PREFILLED_SYRINGE | INTRAVENOUS | Status: DC | PRN
  Administered 2023-10-09: 60 mg via INTRAVENOUS

## 2023-10-09 MED ORDER — MIDAZOLAM HCL 5 MG/5ML IJ SOLN
INTRAMUSCULAR | Status: DC | PRN
  Administered 2023-10-09: 2 mg via INTRAVENOUS

## 2023-10-09 MED ORDER — PHENYLEPHRINE HCL (PRESSORS) 10 MG/ML IV SOLN
INTRAVENOUS | Status: AC
  Filled 2023-10-09: qty 1

## 2023-10-09 MED ORDER — HYDROMORPHONE HCL 1 MG/ML IJ SOLN
0.2500 mg | INTRAMUSCULAR | Status: DC | PRN
Start: 2023-10-09 — End: 2023-10-09

## 2023-10-09 MED ORDER — DEXAMETHASONE SODIUM PHOSPHATE 10 MG/ML IJ SOLN
INTRAMUSCULAR | Status: AC
  Filled 2023-10-09: qty 1

## 2023-10-09 MED ORDER — FENTANYL CITRATE (PF) 100 MCG/2ML IJ SOLN
INTRAMUSCULAR | Status: AC
  Filled 2023-10-09: qty 2

## 2023-10-09 MED ORDER — ACETAMINOPHEN 500 MG PO TABS
1000.0000 mg | ORAL_TABLET | Freq: Once | ORAL | Status: AC
  Administered 2023-10-09: 1000 mg via ORAL
  Filled 2023-10-09: qty 2

## 2023-10-09 MED ORDER — OXYCODONE HCL 5 MG PO TABS: 5.0000 mg | ORAL_TABLET | Freq: Once | ORAL | Status: DC | PRN

## 2023-10-09 MED ORDER — STERILE WATER FOR IRRIGATION IR SOLN
Status: DC | PRN
  Administered 2023-10-09: 500 mL

## 2023-10-09 MED ORDER — PROPOFOL 10 MG/ML IV BOLUS
INTRAVENOUS | Status: DC | PRN
Start: 2023-10-09 — End: 2023-10-09
  Administered 2023-10-09: 200 mg via INTRAVENOUS

## 2023-10-09 MED ORDER — LACTATED RINGERS IV SOLN: INTRAVENOUS | Status: DC

## 2023-10-09 MED ORDER — TRANEXAMIC ACID-NACL 1000-0.7 MG/100ML-% IV SOLN
1000.0000 mg | INTRAVENOUS | Status: AC
  Administered 2023-10-09: 1000 mg via INTRAVENOUS
  Filled 2023-10-09: qty 100

## 2023-10-09 MED ORDER — SODIUM CHLORIDE 0.9 % IR SOLN
3000.0000 mL | Status: DC
  Administered 2023-10-09: 3000 mL

## 2023-10-09 MED ORDER — NITROFURANTOIN MONOHYD MACRO 100 MG PO CAPS: 100.0000 mg | ORAL_CAPSULE | Freq: Every day | ORAL | 0 refills | Status: AC

## 2023-10-09 MED ORDER — 0.9 % SODIUM CHLORIDE (POUR BTL) OPTIME
TOPICAL | Status: DC | PRN
  Administered 2023-10-09: 1000 mL

## 2023-10-09 MED ORDER — DEXAMETHASONE SODIUM PHOSPHATE 10 MG/ML IJ SOLN
INTRAMUSCULAR | Status: DC | PRN
  Administered 2023-10-09: 10 mg via INTRAVENOUS

## 2023-10-09 MED ORDER — AMISULPRIDE (ANTIEMETIC) 5 MG/2ML IV SOLN: 10.0000 mg | Freq: Once | INTRAVENOUS | Status: DC | PRN

## 2023-10-09 SURGICAL SUPPLY — 28 items
BAG URINE DRAIN 2000ML AR STRL (UROLOGICAL SUPPLIES) ×1 IMPLANT
BAND RUBBER #18 3X1/16 STRL (MISCELLANEOUS) IMPLANT
CATH HEMA 3WAY 30CC 22FR COUDE (CATHETERS) IMPLANT
CATH HEMA 3WAY 30CC 24FR COUDE (CATHETERS) IMPLANT
COVER MAYO STAND STRL (DRAPES) ×1 IMPLANT
DRAPE FOOT SWITCH (DRAPES) ×1 IMPLANT
DRAPE SURG IRRIG POUCH 19X23 (DRAPES) IMPLANT
GEL ULTRASOUND 8.5O AQUASONIC (MISCELLANEOUS) ×1 IMPLANT
GLOVE SURG LX STRL 7.5 STRW (GLOVE) ×1 IMPLANT
GOWN STRL REUS W/ TWL XL LVL3 (GOWN DISPOSABLE) ×1 IMPLANT
GUIDEWIRE ANG ZIPWIRE 038X150 (WIRE) IMPLANT
HANDPIECE AQUABEAM (MISCELLANEOUS) ×1 IMPLANT
HOLDER FOLEY CATH W/STRAP (MISCELLANEOUS) IMPLANT
KIT TURNOVER KIT A (KITS) IMPLANT
LOOP CUT BIPOLAR 24F LRG (ELECTROSURGICAL) IMPLANT
MANIFOLD NEPTUNE II (INSTRUMENTS) ×1 IMPLANT
MAT ABSORB FLUID 56X50 GRAY (MISCELLANEOUS) ×1 IMPLANT
PACK CYSTO (CUSTOM PROCEDURE TRAY) ×1 IMPLANT
PACK DRAPE AQUABEAM (MISCELLANEOUS) ×1 IMPLANT
PAD PREP 24X48 CUFFED NSTRL (MISCELLANEOUS) ×1 IMPLANT
PIN SAFETY STERILE (MISCELLANEOUS) IMPLANT
SYR 30ML LL (SYRINGE) ×1 IMPLANT
SYR TOOMEY IRRIG 70ML (MISCELLANEOUS) ×2 IMPLANT
SYRINGE TOOMEY IRRIG 70ML (MISCELLANEOUS) ×2 IMPLANT
TOWEL OR 17X26 10 PK STRL BLUE (TOWEL DISPOSABLE) ×1 IMPLANT
TUBING CONNECTING 10 (TUBING) ×2 IMPLANT
TUBING UROLOGY SET (TUBING) ×1 IMPLANT
UNDERPAD 30X36 HEAVY ABSORB (UNDERPADS AND DIAPERS) ×1 IMPLANT

## 2023-10-09 NOTE — H&P (Addendum)
 H&P  Chief Complaint: BPH, LUTS  History of Present Illness: Dan Nelson is a 68 yo male with h/o BPH and urinary retention. Maintained on finasteride and tamsulosin. He would like to get off meds due to bothersome side effects. They have been effective in bringing IPSS down to 3. Prostate was 64 g on [place]. He is well with no dysuria or gross hematuria. No cough, cold or congestion.   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    Dyslipidemia 10/18/2015   Dyspnea    Essential (primary) hypertension 03/11/2019   GERD (gastroesophageal reflux disease) 10/18/2015   Hyperlipidemia    Liver enzyme elevation    Lumbar radiculopathy 03/11/2019   Obesity (BMI 30.0-34.9) 04/05/2022   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/15/2020   Spinal stenosis, lumbar    Statin intolerance    Urinary retention with incomplete bladder emptying 04/22/2020   Past Surgical History:  Procedure Laterality Date   CARDIAC CATHETERIZATION     CORONARY ARTERY BYPASS GRAFT N/A 04/15/2020   Procedure: Coronary artery bypass grafting times four using left internal mammary artery and endoscopically harvested right saphenous vein graft;  Surgeon: Kerin Perna, MD;  Location: [facility] OR;  Service: Open Heart Surgery;  Laterality: N/A;   Cosmetic ear surgery bilateral, childhood     LEFT HEART CATH AND CORONARY ANGIOGRAPHY N/A 03/16/2020   Procedure: LEFT HEART CATH AND CORONARY ANGIOGRAPHY;  Surgeon: Lyn Records, MD;  Location: [facility] INVASIVE CV LAB;  Service: Cardiovascular;  Laterality: N/A;   TEE WITHOUT CARDIOVERSION N/A 04/15/2020   Procedure: TRANSESOPHAGEAL ECHOCARDIOGRAM (TEE);  Surgeon: Donata Clay, Theron Arista, MD;  Location: [facility] OR;  Service: Open  Heart Surgery;  Laterality: N/A;   traumatic amputation left 3rd finger distal phalanx      Home Medications:  Medications Prior to Admission  Medication Sig Dispense Refill Last Dose/Taking   aspirin 81 MG EC tablet Take 81 mg by mouth daily.   10/02/2023   cholecalciferol (VITAMIN D3) 25 MCG (1000 UNIT) tablet Take 1,000 Units by mouth daily.   10/02/2023   FIBER PO Take 2 tablets by mouth daily.   Past Week   finasteride (PROSCAR) 5 MG tablet Take 5 mg by mouth daily.   10/08/2023   hydrochlorothiazide (HYDRODIURIL) 25 MG tablet Take 0.5 tablets (12.5 mg total) by mouth daily. 45 tablet 1 10/08/2023   losartan (COZAAR) 100 MG tablet Take 1 tablet (100 mg total) by mouth daily. 90 tablet 1 10/08/2023   nitroGLYCERIN (NITROSTAT) 0.4 MG SL tablet Place 0.4 mg under the tongue every 5 (five) minutes as needed for chest pain.   Taking As Needed   rosuvastatin (CRESTOR) 20 MG tablet TAKE 1 TABLET(20 MG) BY MOUTH DAILY 90 tablet 3 10/08/2023   tamsulosin (FLOMAX) 0.4 MG CAPS capsule Take 0.8 mg by mouth at bedtime.   10/08/2023   albuterol (VENTOLIN HFA) 108 (90 Base) MCG/ACT inhaler Inhale 1-2 puffs into the lungs every 6 (six) hours as needed for shortness of breath or wheezing.   More than a month   omeprazole (PRILOSEC) 20 MG capsule Take 20 mg by mouth daily as needed (heartburn or indigestion).   More than a month   Allergies:  Allergies  Allergen  Reactions   Penicillins Swelling and Rash        Augmentin [Amoxicillin-Pot Clavulanate] Swelling   Zocor [Simvastatin] Swelling    Hands swell    Family History  Problem Relation Age of Onset   Hypercholesterolemia Mother    CAD Mother    Heart attack Mother    CAD Father    Heart attack Father    Stroke Father    Hypercholesterolemia Brother    Social History:  reports that he has never smoked. His smokeless tobacco use includes snuff and chew. He reports that he does not drink alcohol and does not use drugs.  ROS: A complete review of  systems was performed.  All systems are negative except for pertinent findings as noted. Review of Systems  All other systems reviewed and are negative.    Physical Exam:  Vital signs in last 24 hours: Temp:  [98.8 F (37.1 C)] 98.8 F (37.1 C) (04/08 0854) Pulse Rate:  [56] 56 (04/08 0854) Resp:  [16] 16 (04/08 0854) BP: (142)/(80) 142/80 (04/08 0854) SpO2:  [94 %] 94 % (04/08 0854) General:  Alert and oriented, No acute distress HEENT: Normocephalic, atraumatic Cardiovascular: Regular rate and rhythm Lungs: Regular rate and effort Abdomen: Soft, nontender, nondistended, no abdominal masses Back: No CVA tenderness Extremities: No edema Neurologic: Grossly intact  Laboratory Data:  No results found for this or any previous visit (from the past 24 hours). No results found for this or any previous visit (from the past 240 hours). Creatinine: Recent Labs    10/02/23 1403  CREATININE 1.06    Impression/Assessment:  BPH, LUTS -   Plan:  I again discussed with the patient and his wife the nature, potential benefits, risks and alternatives to robotic water jet ablation of the prostate, including side effects of the proposed treatment, the likelihood of the patient achieving the goals of the procedure, and any potential problems that might occur during the procedure or recuperation. Discussed wether to preserve ejaculation or maximaize voiding and he would like to maximize voiding. All questions answered. Patient elects to proceed.    Jerilee Field 10/09/2023, 11:00 AM

## 2023-10-09 NOTE — Transfer of Care (Signed)
 Immediate Anesthesia Transfer of Care Note  Patient: Dan Nelson  Procedure(s) Performed: TRANSURETHRAL ABLATION OF THE PROSTATE USING WATERJET  Patient Location: PACU  Anesthesia Type:General  Level of Consciousness: awake, alert , and patient cooperative  Airway & Oxygen Therapy: Patient Spontanous Breathing and Patient connected to face mask oxygen  Post-op Assessment: Report given to RN and Post -op Vital signs reviewed and stable  Post vital signs: stable  Last Vitals:  Vitals Value Taken Time  BP 164/92 10/09/23 1252  Temp    Pulse 59 10/09/23 1254  Resp 14 10/09/23 1254  SpO2 100 % 10/09/23 1254  Vitals shown include unfiled device data.  Last Pain:  Vitals:   10/09/23 0854  TempSrc: Oral  PainSc: 0-No pain         Complications: No notable events documented.

## 2023-10-09 NOTE — Anesthesia Procedure Notes (Signed)
 Procedure Name: Intubation Date/Time: 10/09/2023 11:47 AM  Performed by: Donna Bernard, CRNAPre-anesthesia Checklist: Patient identified, Emergency Drugs available, Suction available, Patient being monitored and Timeout performed Patient Re-evaluated:Patient Re-evaluated prior to induction Oxygen Delivery Method: Circle system utilized Preoxygenation: Pre-oxygenation with 100% oxygen Induction Type: IV induction Ventilation: Mask ventilation without difficulty Laryngoscope Size: Miller and 2 Grade View: Grade I Tube type: Oral Tube size: 7.5 mm Number of attempts: 1 Airway Equipment and Method: Stylet Placement Confirmation: positive ETCO2, ETT inserted through vocal cords under direct vision, CO2 detector and breath sounds checked- equal and bilateral Secured at: 23 cm Tube secured with: Tape Dental Injury: Teeth and Oropharynx as per pre-operative assessment

## 2023-10-09 NOTE — Op Note (Signed)
 Preoperative diagnosis: BPH with lower urinary tract symptoms, weak stream, frequency  Postoperative diagnosis: Same   Procedure: Robotic water jet ablation of the prostate   Surgeon: Mena Goes   Anesthesia: General   Indication for procedure:   Findings:  EUA on exam the penis was circumcised and without mass or lesion.  The glans and meatus.  Normal.  The scrotum was normal.  On DRE the prostate was about 50 g and smooth without hard area nodule.  Cystoscopy revealed lateral lobe obstruction and intravesical component higher bladder neck configuration.  The ureteral orifice ease were visualized and noted to be normal pre and post resection.  Description of procedure:  He was brought to the operating room and placed supine on the operating table.  After adequate anesthesia he was placed lithotomy position. Timeout was performed to confirm the patient and procedure. The TRUS Stepper was mounted to the Articulating Arm and secured to OR bed. The ultrasound probe was attached to the stepper. Exam under anesthesia was performed and the TRUS was inserted per rectum.  There was no resistance. The ultrasound probe was aligned, and confirmation made that the prostate is centered and aligned using both transverse and sagittal views. The bladder neck, verumontanum and the central/transition zones were identified.  Genitalia were prepped and draped in the usual sterile fashion. The 71F AQUABEAM Handpiece is inserted into the prostatic urethra and a complete cystoscopic evaluation was performed by inspecting the prostate, bladder, and identifying the location of the verumontanum/external sphincter. The AQUABEAM Handpiece was secured to the Handpiece Articulating Arm. Confirmed alignment of AQUABEAM Handpiece and TRUS Probe to be parallel and colinear. Confirmation that AQUABEAM nozzle is centered and anterior of the bladder neck or the median lobe. The cystoscope was then retracted to visualize the verumontanum  and external sphincter and the cystoscope tip was positioned just proximal to the external sphincter. Reconfirmed alignment of the TRUS probe with the AQUABEAM Handpiece and compression applied with TRUS probe. Horizontal alignment of the Handpiece waterjet nozzle was performed. The Aquablation treatment zones were planned utilizing real-time TRUS to visualize the contour of the prostate and the depth and radial angles of resection were defined in the transverse view. In the sagittal view, the AQUABEAM nozzle is identified and position registered with software. The treatment contours were then adjusted to conform to the intended resection margins. The median lobe, bladder neck and verumontanum were marked and confirmed in the treatment contour. The Aquablation Treatment was then started following the resection contour confirmed under ultrasound guidance. TOTAL AQUABLATION RESECTION TIME: 6:01  Once Aquablation resection was complete the 22 French aqua beam handpiece was carefully removed.  The continuous-flow sheath with the visual obturator was passed and then the loop and handle.  The trigone and the ureteral orifices were identified.  Resection of some of the residual median lobe and bladder neck tissue was done.  The bladder neck was identified at 6:00 and this was taken up to 12:00 with fulguration of the bladder neck and prostate for hemostasis.  Slight amount of anterior tissue was resected.  Similarly from 6:00 up to 12:00 on the left side of the bladder neck was identified by resecting some of the ablated tissue to identify the bladder neck and cauterize any bleeding.  Some anterior tissue on the left was resected.  This created excellent hemostasis.  All the chips were evacuated.  Ureteral orifices again identified and noted to be normal without injury.  The scope was backed out and a 22  [place] hematuria catheter was placed with 30 cc in the balloon.  The balloon was seated at the bladder neck and it was  irrigated on light traction and noted to be clear to pink.  He was hooked up to CBI.  He was cleaned up and placed supine.  Catheter was placed on traction.  He was awakened and taken to the cover room in stable condition.  Complications: None  Blood loss: 100 mL  Specimens: None  Drains: 22 French three-way hematuria catheter with 30 cc in the balloon  Disposition: Patient stable to PACU

## 2023-10-09 NOTE — Discharge Instructions (Signed)
 Robotic water jet ablation, Care After The following information offers guidance on how to care for yourself after your procedure. Your health care provider may also give you more specific instructions. If you have problems or questions, contact your health care provider. What can I expect after the procedure? After the procedure, it is common to have: Mild pain in your lower abdomen. Soreness or mild discomfort in your penis or when you urinate. This is from having the catheter inserted during the procedure. A sudden urge to urinate (urgency). A need to urinate often. A small amount of blood in your urine. You may notice some small blood clots in your urine. These are normal. Follow these instructions at home: Medicines Take over-the-counter and prescription medicines only as told by your health care provider. If you were prescribed an antibiotic medicine, take it as told by your health care provider. Do not stop taking the antibiotic even if you start to feel better. Activity  Rest as told by your health care provider. Avoid sitting for a long time without moving. Get up to take short walks every 1-2 hours. This is important to improve blood flow and breathing. Ask for help if you feel weak or unsteady. You may increase your physical activity gradually as you start to feel better. Do not drive or operate machinery until your health care provider says that it is safe. Do not ride in a car for long periods of time, or as told by your health care provider. Avoid intense physical activity for as long as told by your health care provider. Do not lift anything that is heavier than 10 lb (4.5 kg), or the limit that you are told, until your health care provider says that it is safe. Do not have sex until your health care provider approves. Return to your normal activities as told by your health care provider. Ask your health care provider what activities are safe for you. Preventing  constipation You may need to take these actions to prevent or treat constipation: Drink enough fluid to keep your urine pale yellow. Take over-the-counter or prescription medicines. Eat foods that are high in fiber, such as beans, whole grains, and fresh fruits and vegetables. Limit foods that are high in fat and processed sugars, such as fried or sweet foods.   General instructions Do not strain when you have a bowel movement. Straining may lead to bleeding from the prostate. This may cause blood clots and trouble urinating. Do not use any products that contain nicotine or tobacco. These products include cigarettes, chewing tobacco, and vaping devices, such as e-cigarettes. If you need help quitting, ask your health care provider. If you go home with a tube draining your urine (urinary catheter), care for the catheter as told by your health care provider. Wear compression stockings as told by your health care provider. These stockings help to prevent blood clots and reduce swelling in your legs. Keep all follow-up visits. This is important. Contact a health care provider if: You have signs of infection, such as: Fever or chills. Urine that smells very bad. Swelling around your urethra that is getting worse. Swelling in your penis or testicles. You have difficulty urinating. You have pain that gets worse or does not improve with medicine. You have blood in your urine that does not go away after 1 week of resting and drinking more fluids. You have trouble having a bowel movement. You have trouble having or keeping an erection. No semen comes out during  orgasm (dry ejaculation). You have a urinary catheter in place, and you have: Spasms or pain. Problems with your catheter or your catheter is blocked. Get help right away if: You are unable to urinate. You are having more blood clots in your urine instead of fewer. You have: Large blood clots. A lot of blood in your urine. Pain in your  back or lower abdomen. You have difficulty breathing or shortness of breath. You develop swelling or pain in your leg. These symptoms may be an emergency. Get help right away. Call 911. Do not wait to see if the symptoms will go away. Do not drive yourself to the hospital. Summary After the procedure, it is common to have a small amount of blood in your urine. Follow restrictions about lifting and sexual activity as told by your health care provider. Ask what activities are safe for you. Keep all follow-up visits. This is important. This information is not intended to replace advice given to you by your health care provider. Make sure you discuss any questions you have with your health care provider. Document Revised: 03/15/2021 Document Reviewed: 03/15/2021 Elsevier Patient Education  2024 [employer].

## 2023-10-09 NOTE — Anesthesia Postprocedure Evaluation (Signed)
 Anesthesia Post Note  Patient: Raylon Blando  Procedure(s) Performed: TRANSURETHRAL ABLATION OF THE PROSTATE USING WATERJET     Patient location during evaluation: PACU Anesthesia Type: General Level of consciousness: awake and alert, oriented and patient cooperative Pain management: pain level controlled Vital Signs Assessment: post-procedure vital signs reviewed and stable Respiratory status: spontaneous breathing, nonlabored ventilation and respiratory function stable Cardiovascular status: blood pressure returned to baseline and stable Postop Assessment: no apparent nausea or vomiting Anesthetic complications: no   No notable events documented.  Last Vitals:  Vitals:   10/09/23 1415 10/09/23 1430  BP: (!) 156/81 (!) 151/85  Pulse: (!) 58 (!) 58  Resp: 14 14  Temp:    SpO2: 96% 94%    Last Pain:  Vitals:   10/09/23 1430  TempSrc:   PainSc: 0-No pain                 Lannie Fields

## 2023-10-10 DIAGNOSIS — T83018A Breakdown (mechanical) of other indwelling urethral catheter, initial encounter: Secondary | ICD-10-CM | POA: Diagnosis not present

## 2023-10-10 DIAGNOSIS — Y838 Other surgical procedures as the cause of abnormal reaction of the patient, or of later complication, without mention of misadventure at the time of the procedure: Secondary | ICD-10-CM | POA: Diagnosis not present

## 2023-10-11 DIAGNOSIS — Y846 Urinary catheterization as the cause of abnormal reaction of the patient, or of later complication, without mention of misadventure at the time of the procedure: Secondary | ICD-10-CM | POA: Diagnosis not present

## 2023-10-16 DIAGNOSIS — R3914 Feeling of incomplete bladder emptying: Secondary | ICD-10-CM | POA: Diagnosis not present

## 2023-10-27 ENCOUNTER — Other Ambulatory Visit: Payer: Self-pay | Admitting: Cardiology

## 2023-11-13 DIAGNOSIS — R3914 Feeling of incomplete bladder emptying: Secondary | ICD-10-CM | POA: Diagnosis not present

## 2024-01-17 ENCOUNTER — Encounter: Payer: Self-pay | Admitting: Cardiology

## 2024-01-17 ENCOUNTER — Ambulatory Visit: Attending: Cardiology | Admitting: Cardiology

## 2024-01-17 VITALS — BP 130/80 | HR 55 | Ht 73.0 in | Wt 246.8 lb

## 2024-01-17 DIAGNOSIS — E785 Hyperlipidemia, unspecified: Secondary | ICD-10-CM

## 2024-01-17 DIAGNOSIS — E66811 Obesity, class 1: Secondary | ICD-10-CM | POA: Diagnosis not present

## 2024-01-17 DIAGNOSIS — I10 Essential (primary) hypertension: Secondary | ICD-10-CM | POA: Diagnosis not present

## 2024-01-17 DIAGNOSIS — Z951 Presence of aortocoronary bypass graft: Secondary | ICD-10-CM

## 2024-01-17 DIAGNOSIS — I2510 Atherosclerotic heart disease of native coronary artery without angina pectoris: Secondary | ICD-10-CM

## 2024-01-17 NOTE — Progress Notes (Signed)
 Cardiology Office Note:    Date:  01/17/2024   ID:  Dan Nelson, DOB [birth year], MRN [id number]  PCP:  Lizette Macario BRAVO, NP (Inactive)  Cardiologist:  Jennifer JONELLE Crape, MD   Referring MD: No ref. provider found    ASSESSMENT:    1. Coronary artery disease involving native coronary artery of native heart without angina pectoris   2. Benign essential hypertension   3. S/P CABG x 4   4. Obesity (BMI 30.0-34.9)   5. Dyslipidemia    PLAN:    In order of problems listed above:  Coronary artery disease: Secondary prevention stressed with the patient.  Importance of compliance with diet medication stressed and he verbalized understanding.  He was advised to walk at least half an hour a day on a daily basis and he is doing so now. Essential hypertension: Blood pressure stable and was emphasized. Obesity: Weight reduction stressed diet emphasized and he promises to do better.  Risks of obesity explained. Mixed dyslipidemia: On lipid-lowering medications followed by primary care.  Lipids reviewed from [facility] sheet and discussed with the patient that they are at goal. Patient will be seen in follow-up appointment in 6 months or earlier if the patient has any concerns.    Medication Adjustments/Labs and Tests Ordered: Current medicines are reviewed at length with the patient today.  Concerns regarding medicines are outlined above.  Orders Placed This Encounter  Procedures   EKG 12-Lead   No orders of the defined types were placed in this encounter.    No chief complaint on file.    History of Present Illness:    Dan Nelson is a 68 y.o. male.  Patient has past medical history of coronary artery disease, essential hypertension, mixed dyslipidemia post CABG surgery.  He denies any problems at this time and takes care of activities of daily living.  He is retired since 2 months send walking on a regular basis without any symptoms.  At the time of my evaluation, the patient is alert awake  oriented and in no distress.  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    Coronary artery disease    Coronary atherosclerosis 04/07/2020   Drug therapy 10/18/2015   Dyslipidemia 10/18/2015   Dyspnea    Essential (primary) hypertension 03/11/2019   GERD (gastroesophageal reflux disease) 10/18/2015   GERD without esophagitis    Hyperlipidemia    Liver enzyme elevation    Lumbar radiculopathy 03/11/2019   Obesity    Obesity (BMI 30.0-34.9) 04/05/2022   Postop check 05/12/2020   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/15/2020   Spinal stenosis, lumbar    Statin intolerance    Urinary retention with incomplete bladder emptying 04/22/2020    Past Surgical History:  Procedure Laterality Date   CARDIAC CATHETERIZATION     CORONARY ARTERY BYPASS GRAFT N/A 04/15/2020   Procedure: Coronary artery bypass grafting times four using left internal mammary artery and endoscopically harvested right saphenous vein graft;  Surgeon: Fleeta Hanford Coy, MD;  Location: [facility] OR;  Service: Open Heart Surgery;  Laterality: N/A;   Cosmetic ear surgery bilateral,  childhood     LEFT HEART CATH AND CORONARY ANGIOGRAPHY N/A 03/16/2020   Procedure: LEFT HEART CATH AND CORONARY ANGIOGRAPHY;  Surgeon: Claudene Victory ORN, MD;  Location: [facility] INVASIVE CV LAB;  Service: Cardiovascular;  Laterality: N/A;   TEE WITHOUT CARDIOVERSION N/A 04/15/2020   Procedure: TRANSESOPHAGEAL ECHOCARDIOGRAM (TEE);  Surgeon: Fleeta Ochoa, Maude, MD;  Location: [facility] OR;  Service: Open Heart Surgery;  Laterality: N/A;   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   aspirin  EC 81 MG tablet Take 1 tablet (81 mg total) by mouth daily.   cholecalciferol (VITAMIN D3) 25 MCG (1000 UNIT) tablet Take 1,000 Units by mouth daily.   FIBER PO Take 2 tablets by mouth daily.   finasteride (PROSCAR) 5 MG tablet Take 5 mg by mouth daily.   hydrochlorothiazide  (HYDRODIURIL ) 25 MG tablet Take 0.5 tablets (12.5 mg total) by mouth daily.   losartan  (COZAAR ) 100 MG tablet Take 1 tablet (100 mg total) by mouth daily.   nitrofurantoin , macrocrystal-monohydrate, (MACROBID ) 100 MG capsule Take 1 capsule (100 mg total) by mouth at bedtime.   nitroGLYCERIN  (NITROSTAT ) 0.4 MG SL tablet Place 0.4 mg under the tongue every 5 (five) minutes as needed for chest pain.   omeprazole (PRILOSEC) 20 MG capsule Take 20 mg by mouth daily as needed (heartburn or indigestion).   rosuvastatin  (CRESTOR ) 20 MG tablet TAKE 1 TABLET(20 MG) BY MOUTH DAILY     Allergies:   Penicillins, Augmentin [amoxicillin-pot clavulanate], and Zocor [simvastatin]   Social History   Socioeconomic History   Marital status: Married    Spouse name: Not on file   Number of children: Not on file   Years of education: Not on file   Highest education level: Not on file  Occupational History   Not on file  Tobacco Use   Smoking status: Never   Smokeless tobacco: Current    Types: Snuff, Chew  Vaping Use   Vaping status: Never Used  Substance and Sexual Activity   Alcohol use: Never   Drug use: Never   Sexual activity: Not on file  Other Topics Concern   Not on file  Social History Narrative   Not on file   Social Drivers of Health   Financial Resource Strain: Not on file  Food Insecurity: Not on file  Transportation Needs: Not on file  Physical Activity: Not on  file  Stress: Not on file  Social Connections: Not on file     Family History: The patient's family history includes CAD in his father and mother; Heart attack in his father and mother; Hypercholesterolemia in his brother and mother; Stroke in his father.  ROS:   Please see the history of present illness.    All other systems reviewed and are negative.  EKGs/Labs/Other Studies Reviewed:    The following studies were reviewed today: .SABRAEKG Interpretation Date/Time:  Thursday January 17 2024 13:09:25 EDT Ventricular Rate:  55 PR Interval:  176 QRS Duration:  106 QT Interval:  404 QTC Calculation: 386 R Axis:   -24  Text Interpretation: Sinus bradycardia Possible Anterior infarct , age undetermined Abnormal ECG When compared with ECG of 11-Jan-2023 13:12, No significant change was found Confirmed by Edwyna Backers [phone]) on 01/17/2024 1:27:35 PM  Recent Labs: 10/02/2023: BUN 17; Creatinine, Ser 1.06; Hemoglobin 15.5; Platelets 220; Potassium 3.7; Sodium 136  Recent Lipid Panel    Component Value Date/Time   CHOL 115 11/18/2020 0947   TRIG 77 11/18/2020 0947   HDL 35 (L) 11/18/2020 0947   CHOLHDL 3.3 11/18/2020 0947   LDLCALC 64 11/18/2020 0947    Physical Exam:    VS:  BP 130/80   Pulse (!) 55   Ht 6' 1 (1.854 m)   Wt 246 lb 12.8 oz (111.9 kg)   SpO2 96%   BMI 32.56 kg/m     Wt Readings from Last 3 Encounters:  01/17/24 246 lb 12.8 oz (111.9 kg)  10/02/23 250 lb (113.4 kg)  01/11/23 250 lb (113.4 kg)     GEN: Patient is in no acute distress HEENT: Normal NECK: No JVD; No carotid bruits LYMPHATICS: No lymphadenopathy CARDIAC: Hear sounds regular, 2/6 systolic murmur at the apex. RESPIRATORY:  Clear to auscultation without rales, wheezing or rhonchi  ABDOMEN: Soft, non-tender, non-distended MUSCULOSKELETAL:  No edema; No deformity  SKIN: Warm and dry NEUROLOGIC:  Alert and oriented x 3 PSYCHIATRIC:  Normal affect   Signed, Jennifer JONELLE Crape, MD   01/17/2024 1:28 PM    [facility]

## 2024-01-17 NOTE — Patient Instructions (Signed)

## 2024-01-19 ENCOUNTER — Other Ambulatory Visit: Payer: Self-pay | Admitting: Cardiology

## 2024-01-29 ENCOUNTER — Other Ambulatory Visit: Payer: Self-pay | Admitting: Cardiology

## 2024-02-14 ENCOUNTER — Other Ambulatory Visit: Payer: Self-pay | Admitting: Cardiology

## 2024-02-25 DIAGNOSIS — R3914 Feeling of incomplete bladder emptying: Secondary | ICD-10-CM | POA: Diagnosis not present

## 2024-02-27 DIAGNOSIS — K219 Gastro-esophageal reflux disease without esophagitis: Secondary | ICD-10-CM | POA: Diagnosis not present

## 2024-02-27 DIAGNOSIS — E785 Hyperlipidemia, unspecified: Secondary | ICD-10-CM | POA: Diagnosis not present

## 2024-02-27 DIAGNOSIS — Z139 Encounter for screening, unspecified: Secondary | ICD-10-CM | POA: Diagnosis not present

## 2024-02-27 DIAGNOSIS — I10 Essential (primary) hypertension: Secondary | ICD-10-CM | POA: Diagnosis not present

## 2024-02-27 DIAGNOSIS — N138 Other obstructive and reflux uropathy: Secondary | ICD-10-CM | POA: Diagnosis not present

## 2024-02-27 DIAGNOSIS — I2510 Atherosclerotic heart disease of native coronary artery without angina pectoris: Secondary | ICD-10-CM | POA: Diagnosis not present

## 2024-03-17 DIAGNOSIS — K219 Gastro-esophageal reflux disease without esophagitis: Secondary | ICD-10-CM | POA: Diagnosis not present

## 2024-03-18 ENCOUNTER — Telehealth: Payer: Self-pay

## 2024-03-18 NOTE — Telephone Encounter (Signed)
   Pre-operative Risk Assessment    Patient Name: Dan Nelson  DOB: 1955-10-21 MRN: [id number]   Date of last office visit: 01/17/2024 Date of next office visit: N/A   Request for Surgical Clearance    Procedure:  Colonoscopy   Date of Surgery:  Clearance 04/11/24                                Surgeon:  Not listed Surgeon's Group or Practice Name:  [facility] Phone number:  [phone] Fax number:  [phone]   Type of Clearance Requested:   - Pharmacy:  Hold Aspirin  5 days prior    Type of Anesthesia:  Not Indicated   Additional requests/questions:    Dan Nelson   03/18/2024, 3:29 PM

## 2024-03-24 ENCOUNTER — Other Ambulatory Visit: Payer: Self-pay | Admitting: Cardiology

## 2024-03-24 ENCOUNTER — Telehealth: Payer: Self-pay | Admitting: Cardiology

## 2024-03-24 NOTE — Telephone Encounter (Signed)
*  STAT* If patient is at the pharmacy, call can be transferred to refill team.   1. Which medications need to be refilled? (please list name of each medication and dose if known)   hydrochlorothiazide  (HYDRODIURIL ) 25 MG tablet    4. Which pharmacy/location (including street and city if local pharmacy) is medication to be sent to?  [facility] [phone] - [place],  - [address]     5. Do they need a 30 day or 90 day supply? 90

## 2024-03-25 MED ORDER — HYDROCHLOROTHIAZIDE 25 MG PO TABS: 12.5000 mg | ORAL_TABLET | Freq: Every day | ORAL | 3 refills | Status: AC

## 2024-03-25 NOTE — Telephone Encounter (Signed)
 Pt's medication was sent to pt's pharmacy as requested. Confirmation received.

## 2024-03-25 NOTE — Telephone Encounter (Signed)
 Pt is calling because he is out of medication. Was suppose to be be sent yesterday as urgent,

## 2024-03-25 NOTE — Addendum Note (Signed)
 Addended by: BLUFORD RAMP D on: 03/25/2024 12:39 PM   Modules accepted: Orders

## 2024-04-02 NOTE — Telephone Encounter (Signed)
 Can you please address request for clearance for colonoscopy 10/10?  Please route your response to p cv div preop.  Thank you, Rosaline EMERSON Bane, NP-C 04/02/2024, 7:15 AM [address] [place], [place] [address] Office [phone] Fax [phone]

## 2024-04-03 NOTE — Telephone Encounter (Signed)
   Name: Dan Nelson  DOB: [birth year]  MRN: [id number]  Primary Cardiologist: None   Preoperative team, please contact this patient and set up a phone call appointment for further preoperative risk assessment. Please obtain consent and complete medication review. Thank you for your help.  I confirm that guidance regarding antiplatelet and oral anticoagulation therapy has been completed and, if necessary, noted below.  I also confirmed the patient resides in the state of [place] . As per [facility] Medical Board telemedicine laws, the patient must reside in the state in which the provider is licensed.   Jon Nat Hails, PA 04/03/2024, 4:37 PM [facility]

## 2024-04-03 NOTE — Telephone Encounter (Signed)
 Left message to call back to schedule a tele pre op appt.  ?

## 2024-04-03 NOTE — Telephone Encounter (Signed)
PRIMARY CARD IS DR. REVANKAR.

## 2024-04-04 ENCOUNTER — Telehealth (HOSPITAL_BASED_OUTPATIENT_CLINIC_OR_DEPARTMENT_OTHER): Payer: Self-pay | Admitting: *Deleted

## 2024-04-04 NOTE — Telephone Encounter (Signed)
 Pt returning call

## 2024-04-04 NOTE — Telephone Encounter (Signed)
 Pt has been added on 04/08/24 ok per preop APP [name], NP. Pt did state the GI doctor told him to be gin to hold his ASA 04/06/24. Med rec and consent are done.      Patient Consent for Virtual Visit        [name] has provided verbal consent on 04/04/2024 for a virtual visit (video or telephone).   CONSENT FOR VIRTUAL VISIT FOR:  Dan Nelson  By participating in this virtual visit I agree to the following:  I hereby voluntarily request, consent and authorize [facility] and its employed or contracted physicians, physician assistants, nurse practitioners or other licensed health care professionals (the Practitioner), to provide me with telemedicine health care services (the "Services) as deemed necessary by the treating Practitioner. I acknowledge and consent to receive the Services by the Practitioner via telemedicine. I understand that the telemedicine visit will involve communicating with the Practitioner through live audiovisual communication technology and the disclosure of certain medical information by electronic transmission. I acknowledge that I have been given the opportunity to request an in-person assessment or other available alternative prior to the telemedicine visit and am voluntarily participating in the telemedicine visit.  I understand that I have the right to withhold or withdraw my consent to the use of telemedicine in the course of my care at any time, without affecting my right to future care or treatment, and that the Practitioner or I may terminate the telemedicine visit at any time. I understand that I have the right to inspect all information obtained and/or recorded in the course of the telemedicine visit and may receive copies of available information for a reasonable fee.  I understand that some of the potential risks of receiving the Services via telemedicine include:  Delay or interruption in medical evaluation due to technological equipment  failure or disruption; Information transmitted may not be sufficient (e.g. poor resolution of images) to allow for appropriate medical decision making by the Practitioner; and/or  In rare instances, security protocols could fail, causing a breach of personal health information.  Furthermore, I acknowledge that it is my responsibility to provide information about my medical history, conditions and care that is complete and accurate to the best of my ability. I acknowledge that Practitioner's advice, recommendations, and/or decision may be based on factors not within their control, such as incomplete or inaccurate data provided by me or distortions of diagnostic images or specimens that may result from electronic transmissions. I understand that the practice of medicine is not an exact science and that Practitioner makes no warranties or guarantees regarding treatment outcomes. I acknowledge that a copy of this consent can be made available to me via my patient portal [facility] MyChart), or I can request a printed copy by calling the office of [name].    I understand that my insurance will be billed for this visit.   I have read or had this consent read to me. I understand the contents of this consent, which adequately explains the benefits and risks of the Services being provided via telemedicine.  I have been provided ample opportunity to ask questions regarding this consent and the Services and have had my questions answered to my satisfaction. I give my informed consent for the services to be provided through the use of telemedicine in my medical care

## 2024-04-04 NOTE — Telephone Encounter (Signed)
 Pt has been added on 04/08/24 ok per preop APP [name], NP. Pt did state the GI doctor told him to be gin to hold his ASA 04/06/24. Med rec and consent are done.

## 2024-04-08 ENCOUNTER — Ambulatory Visit: Attending: Cardiovascular Disease | Admitting: Student

## 2024-04-08 DIAGNOSIS — Z01810 Encounter for preprocedural cardiovascular examination: Secondary | ICD-10-CM | POA: Diagnosis not present

## 2024-04-08 NOTE — Progress Notes (Signed)
 Virtual Visit via Telephone Note   Because of Dan Nelson's co-morbid illnesses, he is at least at moderate risk for complications without adequate follow up.  This format is felt to be most appropriate for this patient at this time.  The patient did not have access to video technology/had technical difficulties with video requiring transitioning to audio format only (telephone).  All issues noted in this document were discussed and addressed.  No physical exam could be performed with this format.  Please refer to the patient's chart for his consent to telehealth for [facility].  Evaluation Performed:  Preoperative cardiovascular risk assessment _____________   Date:  04/08/2024   Patient ID:  Dan Nelson, DOB 1956-05-10, MRN [id number] Patient Location:  Home Provider location:   Office  Primary Care Provider:  Lizette Macario BRAVO, NP (Inactive)  Primary Cardiologist:  [facility] Providers Cardiologist:  Dan JONELLE Crape, MD    Chief Complaint / Patient Profile   68 y.o. y/o male with a h/o CAD s/p CABG x 05 April 2020, normal low risk nuclear stress test March 2023, PAF on anticoagulation, hypertension, hyperlipidemia, GERD, smokeless tobacco abuse who is pending colonoscopy by [place] [facility] on 04/11/2024 and presents today for telephonic preoperative cardiovascular risk assessment.  History of Present Illness    Dan Nelson is a 68 y.o. male who presents via audio/video conferencing for a telehealth visit today.  Pt was last seen in cardiology clinic on 01/17/2024 by Dr. Crape.  At that time Dan Nelson was stable from a cardiac standpoint.  The patient is now pending procedure as outlined above. Since his last visit, he is doing well. Patient denies shortness of breath, dyspnea on exertion, lower extremity edema, orthopnea or PND. No chest pain, pressure, or tightness. No palpitations.  No lightheadedness, dizziness, presyncope or syncope. He  is active walking for exercise and working part time at a dairy  Past Medical History    Past Medical History:  Diagnosis Date   Abnormal nuclear stress test 03/11/2020   Allergy 02/25/2020   Angina pectoris 03/11/2020   Atherosclerotic heart disease 04/07/2020   Atrial fibrillation (HCC) 04/22/2020   Benign essential hypertension    Body mass index (BMI) 33.0-33.9, adult 10/18/2015   CAD (coronary artery disease) 04/07/2020   Cardiac murmur 02/26/2020   Chest tightness 02/26/2020   Chewing tobacco nicotine dependence with nicotine-induced disorder    Coronary artery disease    Coronary atherosclerosis 04/07/2020   Drug therapy 10/18/2015   Dyslipidemia 10/18/2015   Dyspnea    Essential (primary) hypertension 03/11/2019   GERD (gastroesophageal reflux disease) 10/18/2015   GERD without esophagitis    Hyperlipidemia    Liver enzyme elevation    Lumbar radiculopathy 03/11/2019   Obesity    Obesity (BMI 30.0-34.9) 04/05/2022   Postop check 05/12/2020   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/15/2020   Spinal stenosis, lumbar    Statin intolerance    Urinary retention with incomplete bladder emptying 04/22/2020   Past Surgical History:  Procedure Laterality Date   CARDIAC CATHETERIZATION     CORONARY ARTERY BYPASS GRAFT N/A 04/15/2020   Procedure: Coronary artery bypass grafting times four using left internal mammary artery and endoscopically harvested right saphenous vein graft;  Surgeon: Fleeta Hanford Coy, MD;  Location: [facility] OR;  Service: Open Heart Surgery;  Laterality: N/A;   Cosmetic ear surgery bilateral, childhood     LEFT HEART CATH AND CORONARY ANGIOGRAPHY N/A 03/16/2020   Procedure: LEFT HEART CATH AND  CORONARY ANGIOGRAPHY;  Surgeon: Claudene Victory ORN, MD;  Location: [facility] INVASIVE CV LAB;  Service: Cardiovascular;  Laterality: N/A;   TEE WITHOUT CARDIOVERSION N/A 04/15/2020   Procedure: TRANSESOPHAGEAL ECHOCARDIOGRAM (TEE);  Surgeon: Fleeta Ochoa, Maude, MD;  Location: [facility] OR;  Service: Open Heart  Surgery;  Laterality: N/A;   traumatic amputation left 3rd finger distal phalanx      Allergies  Allergies  Allergen Reactions   Penicillins Swelling and Rash        Augmentin [Amoxicillin-Pot Clavulanate] Swelling   Zocor [Simvastatin] Swelling    Hands swell    Home Medications    Prior to Admission medications   Medication Sig Start Date End Date Taking? Authorizing Provider  albuterol  (VENTOLIN  HFA) 108 (90 Base) MCG/ACT inhaler Inhale 1-2 puffs into the lungs every 6 (six) hours as needed for shortness of breath or wheezing. 08/24/23   [provider]  aspirin  EC 81 MG tablet Take 1 tablet (81 mg total) by mouth daily. 10/11/23   Nieves Cough, MD  cholecalciferol (VITAMIN D3) 25 MCG (1000 UNIT) tablet Take 1,000 Units by mouth daily.    [provider]  FIBER PO Take 2 tablets by mouth daily.    [provider]  finasteride (PROSCAR) 5 MG tablet Take 5 mg by mouth daily. 05/31/21   [provider]  hydrochlorothiazide  (HYDRODIURIL ) 25 MG tablet Take 0.5 tablets (12.5 mg total) by mouth daily. 03/25/24   Revankar, Dan SAUNDERS, MD  losartan  (COZAAR ) 100 MG tablet TAKE 1 TABLET(100 MG) BY MOUTH DAILY 02/14/24   Revankar, Dan SAUNDERS, MD  nitrofurantoin , macrocrystal-monohydrate, (MACROBID ) 100 MG capsule Take 1 capsule (100 mg total) by mouth at bedtime. 10/09/23   Nieves Cough, MD  nitroGLYCERIN  (NITROSTAT ) 0.4 MG SL tablet Place 0.4 mg under the tongue every 5 (five) minutes as needed for chest pain.    [provider]  omeprazole (PRILOSEC) 20 MG capsule Take 20 mg by mouth daily as needed (heartburn or indigestion). 08/18/22   [provider]  rosuvastatin  (CRESTOR ) 20 MG tablet TAKE 1 TABLET(20 MG) BY MOUTH DAILY 01/21/24   Revankar, Dan SAUNDERS, MD    Physical Exam    Vital Signs:  Dan Nelson does not have vital signs available for review today.  Given telephonic nature of communication, physical exam is limited. AAOx3.  NAD. Normal affect.  Speech and respirations are unlabored.   Assessment & Plan    Preoperative cardiovascular risk assessment.  Colonoscopy by [place] [facility] 04/11/2024  Chart reviewed as part of pre-operative protocol coverage. According to the RCRI, patient has a 0.9% risk of MACE. Patient reports activity equivalent to 4.0 METS (walks for exercise and works part time at a dairy).   Given past medical history and time since last visit, based on ACC/AHA guidelines, Dan Nelson would be at acceptable risk for the planned procedure without further cardiovascular testing.   Patient was advised that if he develops new symptoms prior to surgery to contact our office to arrange a follow-up appointment.  he verbalized understanding.  Patient should continue aspirin  81 mg daily throughout peri-procedural period.      I will route this recommendation to the requesting party via Epic fax function.  Please call with questions.  Time:   Today, I have spent 5 minutes with the patient with telehealth technology discussing medical history, symptoms, and management plan.     Barnie Hila, NP  04/08/2024, 11:35 AM

## 2024-04-11 DIAGNOSIS — F17210 Nicotine dependence, cigarettes, uncomplicated: Secondary | ICD-10-CM | POA: Diagnosis not present

## 2024-04-11 DIAGNOSIS — Z860100 Personal history of colon polyps, unspecified: Secondary | ICD-10-CM | POA: Diagnosis not present

## 2024-04-11 DIAGNOSIS — Z880 Allergy status to penicillin: Secondary | ICD-10-CM | POA: Diagnosis not present

## 2024-04-11 DIAGNOSIS — I10 Essential (primary) hypertension: Secondary | ICD-10-CM | POA: Diagnosis not present

## 2024-04-11 DIAGNOSIS — Z1211 Encounter for screening for malignant neoplasm of colon: Secondary | ICD-10-CM | POA: Diagnosis not present

## 2024-04-11 DIAGNOSIS — K219 Gastro-esophageal reflux disease without esophagitis: Secondary | ICD-10-CM | POA: Diagnosis not present

## 2024-04-11 DIAGNOSIS — Z951 Presence of aortocoronary bypass graft: Secondary | ICD-10-CM | POA: Diagnosis not present

## 2024-04-11 DIAGNOSIS — Z860101 Personal history of adenomatous and serrated colon polyps: Secondary | ICD-10-CM | POA: Diagnosis not present

## 2024-04-11 DIAGNOSIS — K5730 Diverticulosis of large intestine without perforation or abscess without bleeding: Secondary | ICD-10-CM | POA: Diagnosis not present
